# Patient Record
Sex: Male | Born: 1970 | Race: White | Hispanic: No | Marital: Married | Smoking: Current every day smoker
Health system: Southern US, Community
[De-identification: ages and names within clinical notes are randomized; demographics above are authoritative.]

## PROBLEM LIST (undated history)

## (undated) DIAGNOSIS — E119 Type 2 diabetes mellitus without complications: Secondary | ICD-10-CM

## (undated) DIAGNOSIS — F419 Anxiety disorder, unspecified: Secondary | ICD-10-CM

## (undated) HISTORY — PX: APPENDECTOMY: SHX54

---

## 2004-06-30 ENCOUNTER — Other Ambulatory Visit: Payer: Self-pay

## 2007-07-01 ENCOUNTER — Ambulatory Visit: Payer: Self-pay | Admitting: Internal Medicine

## 2008-03-05 ENCOUNTER — Emergency Department: Payer: Self-pay | Admitting: Emergency Medicine

## 2008-04-01 ENCOUNTER — Emergency Department: Payer: Self-pay | Admitting: Internal Medicine

## 2008-05-23 ENCOUNTER — Emergency Department: Payer: Self-pay | Admitting: Emergency Medicine

## 2009-07-11 ENCOUNTER — Emergency Department: Payer: Self-pay | Admitting: Emergency Medicine

## 2009-07-22 ENCOUNTER — Emergency Department: Payer: Self-pay | Admitting: Internal Medicine

## 2009-08-02 ENCOUNTER — Emergency Department: Payer: Self-pay | Admitting: Emergency Medicine

## 2009-08-04 ENCOUNTER — Emergency Department: Payer: Self-pay | Admitting: Unknown Physician Specialty

## 2009-10-31 ENCOUNTER — Ambulatory Visit: Payer: Self-pay | Admitting: Orthopedic Surgery

## 2009-11-02 ENCOUNTER — Emergency Department: Payer: Self-pay | Admitting: Emergency Medicine

## 2009-11-06 ENCOUNTER — Ambulatory Visit: Payer: Self-pay | Admitting: Emergency Medicine

## 2012-07-03 ENCOUNTER — Emergency Department: Payer: Self-pay | Admitting: *Deleted

## 2013-08-11 ENCOUNTER — Emergency Department: Payer: Self-pay | Admitting: Emergency Medicine

## 2013-08-11 LAB — COMPREHENSIVE METABOLIC PANEL
Albumin: 4.1 g/dL (ref 3.4–5.0)
Alkaline Phosphatase: 84 U/L (ref 50–136)
Bilirubin,Total: 0.3 mg/dL (ref 0.2–1.0)
Chloride: 105 mmol/L (ref 98–107)
Co2: 26 mmol/L (ref 21–32)
EGFR (Non-African Amer.): >60
Glucose: 113 mg/dL — ABNORMAL HIGH (ref 65–99)
SGOT(AST): 39 U/L — ABNORMAL HIGH (ref 15–37)
Sodium: 137 mmol/L (ref 136–145)
Total Protein: 7.6 g/dL (ref 6.4–8.2)

## 2013-08-11 LAB — CBC
MCHC: 34.7 g/dL (ref 32.0–36.0)
MCV: 82 fL (ref 80–100)
RBC: 5.25 10*6/uL (ref 4.40–5.90)
RDW: 13.6 % (ref 11.5–14.5)
WBC: 11.8 10*3/uL — ABNORMAL HIGH (ref 3.8–10.6)

## 2013-08-11 LAB — URINALYSIS, COMPLETE
Bilirubin,UR: NEGATIVE
Glucose,UR: NEGATIVE mg/dL (ref 0–75)
Ketone: NEGATIVE
Nitrite: NEGATIVE
Squamous Epithelial: <1

## 2013-08-11 LAB — LIPASE, BLOOD: Lipase: 90 U/L (ref 73–393)

## 2013-10-20 ENCOUNTER — Emergency Department: Payer: Self-pay | Admitting: Emergency Medicine

## 2014-01-22 ENCOUNTER — Emergency Department: Payer: Self-pay | Admitting: Emergency Medicine

## 2014-01-22 LAB — CBC
HCT: 42.6 % (ref 40.0–52.0)
HGB: 14.2 g/dL (ref 13.0–18.0)
MCH: 27.9 pg (ref 26.0–34.0)
MCHC: 33.3 g/dL (ref 32.0–36.0)
MCV: 84 fL (ref 80–100)
Platelet: 226 10*3/uL (ref 150–440)
RBC: 5.08 10*6/uL (ref 4.40–5.90)
RDW: 13.4 % (ref 11.5–14.5)
WBC: 8.2 10*3/uL (ref 3.8–10.6)

## 2014-01-22 LAB — BASIC METABOLIC PANEL
Anion Gap: 6 — ABNORMAL LOW (ref 7–16)
BUN: 10 mg/dL (ref 7–18)
CHLORIDE: 104 mmol/L (ref 98–107)
CO2: 29 mmol/L (ref 21–32)
Calcium, Total: 8.6 mg/dL (ref 8.5–10.1)
Creatinine: 1.27 mg/dL (ref 0.60–1.30)
EGFR (African American): >60
EGFR (Non-African Amer.): >60
Glucose: 160 mg/dL — ABNORMAL HIGH (ref 65–99)
OSMOLALITY: 280 (ref 275–301)
Potassium: 3.9 mmol/L (ref 3.5–5.1)
Sodium: 139 mmol/L (ref 136–145)

## 2014-01-22 LAB — TROPONIN I: Troponin-I: <0.02 ng/mL

## 2014-02-26 ENCOUNTER — Emergency Department: Payer: Self-pay | Admitting: Emergency Medicine

## 2014-02-26 LAB — CBC
HCT: 44.6 % (ref 40.0–52.0)
HGB: 15.1 g/dL (ref 13.0–18.0)
MCH: 28.5 pg (ref 26.0–34.0)
MCHC: 34 g/dL (ref 32.0–36.0)
MCV: 84 fL (ref 80–100)
PLATELETS: 215 10*3/uL (ref 150–440)
RBC: 5.31 10*6/uL (ref 4.40–5.90)
RDW: 13 % (ref 11.5–14.5)
WBC: 6.5 10*3/uL (ref 3.8–10.6)

## 2014-02-26 LAB — COMPREHENSIVE METABOLIC PANEL
ALBUMIN: 3.9 g/dL (ref 3.4–5.0)
ALK PHOS: 76 U/L
AST: 27 U/L (ref 15–37)
Anion Gap: 4 — ABNORMAL LOW (ref 7–16)
BILIRUBIN TOTAL: 0.3 mg/dL (ref 0.2–1.0)
BUN: 11 mg/dL (ref 7–18)
CHLORIDE: 106 mmol/L (ref 98–107)
Calcium, Total: 8.8 mg/dL (ref 8.5–10.1)
Co2: 29 mmol/L (ref 21–32)
Creatinine: 1.13 mg/dL (ref 0.60–1.30)
EGFR (African American): >60
EGFR (Non-African Amer.): >60
GLUCOSE: 143 mg/dL — AB (ref 65–99)
Osmolality: 279 (ref 275–301)
POTASSIUM: 4.1 mmol/L (ref 3.5–5.1)
SGPT (ALT): 35 U/L (ref 12–78)
SODIUM: 139 mmol/L (ref 136–145)
Total Protein: 7.4 g/dL (ref 6.4–8.2)

## 2014-12-08 ENCOUNTER — Emergency Department: Payer: Self-pay | Admitting: Student

## 2014-12-21 ENCOUNTER — Emergency Department: Payer: Self-pay | Admitting: Internal Medicine

## 2016-01-01 ENCOUNTER — Encounter: Payer: Self-pay | Admitting: Emergency Medicine

## 2016-01-01 ENCOUNTER — Emergency Department
Admission: EM | Admit: 2016-01-01 | Discharge: 2016-01-01 | Disposition: A | Discharge status: Home / Self Care | Payer: Self-pay | Attending: Emergency Medicine | Admitting: Emergency Medicine

## 2016-01-01 ENCOUNTER — Emergency Department: Payer: Self-pay

## 2016-01-01 DIAGNOSIS — J069 Acute upper respiratory infection, unspecified: Secondary | ICD-10-CM | POA: Insufficient documentation

## 2016-01-01 DIAGNOSIS — J40 Bronchitis, not specified as acute or chronic: Secondary | ICD-10-CM

## 2016-01-01 DIAGNOSIS — F17200 Nicotine dependence, unspecified, uncomplicated: Secondary | ICD-10-CM | POA: Insufficient documentation

## 2016-01-01 DIAGNOSIS — J209 Acute bronchitis, unspecified: Secondary | ICD-10-CM | POA: Insufficient documentation

## 2016-01-01 MED ORDER — AZITHROMYCIN 250 MG PO TABS: ORAL_TABLET | ORAL | Status: DC

## 2016-01-01 MED ORDER — GUAIFENESIN-CODEINE 100-10 MG/5ML PO SOLN: 10.0000 mL | ORAL | Status: DC | PRN

## 2016-01-01 NOTE — ED Notes (Signed)
See triage   Congestion,cough and body aches Friday .no apparent distress noted at present  Afebrile on arrival

## 2016-01-01 NOTE — ED Provider Notes (Signed)
[facility] Emergency Department Provider Note  ____________________________________________  Time seen: Approximately 2:13 PM  I have reviewed the triage vital signs and the nursing notes.   HISTORY  Chief Complaint URI    HPI Rodney Phillips is a 45 y.o. male presents for cough congestion and body aches for week. Patient states the congestion and body aches is pretty much resolved but he still coughing a lot. States he is at least a pack a day smoker when he is healthy but has not been smoking much lately. Coughs so much that his head hurts and stomach muscles hurt. Has tried over-the-counter Mucinex and TheraFlu.   History reviewed. No pertinent past medical history.  There are no active problems to display for this patient.   History reviewed. No pertinent past surgical history.  Current Outpatient Rx  Name  Route  Sig  Dispense  Refill  . azithromycin (ZITHROMAX Z-PAK) 250 MG tablet      Take 2 tablets (500 mg) on  Day 1,  followed by 1 tablet (250 mg) once daily on Days 2 through 5.   6 each   0   . guaiFENesin-codeine 100-10 MG/5ML syrup   Oral   Take 10 mLs by mouth every 4 (four) hours as needed for cough.   180 mL   0     Allergies Review of patient's allergies indicates no known allergies.  No family history on file.  Social History Social History  Substance Use Topics  . Smoking status: Current Every Day Smoker  . Smokeless tobacco: None  . Alcohol Use: No    Review of Systems Constitutional: No fever/chills ENT: No sore throat. Cardiovascular: Denies chest pain. Respiratory: Positive for cough and some shortness of breath. Skin: Negative for rash. Neurological: Negative for headaches, focal weakness or numbness.  10-point ROS otherwise negative.  ____________________________________________   PHYSICAL EXAM:  VITAL SIGNS: ED Triage Vitals  Enc Vitals Group     BP 01/01/16 1344 138/86 mmHg     Pulse Rate  01/01/16 1344 109     Resp 01/01/16 1344 16     Temp 01/01/16 1344 98.2 F (36.8 C)     Temp Source 01/01/16 1344 Oral     SpO2 01/01/16 1344 94 %     Weight 01/01/16 1344 260 lb (117.935 kg)     Height 01/01/16 1344  (1.88 m)     Head Cir --      Peak Flow --      Pain Score 01/01/16 1345 7     Pain Loc --      Pain Edu? --      Excl. in GC? --     Constitutional: Alert and oriented. Well appearing and in no acute distress. Nose: No congestion/rhinnorhea. Mouth/Throat: Mucous membranes are moist.  Oropharynx non-erythematous. Neck: No stridor.   Cardiovascular: Normal rate, regular rhythm. Grossly normal heart sounds.  Good peripheral circulation. Respiratory: Normal respiratory effort.  No retractions. Lungs expiratory wheezing noted bilateral decreased breath sounds. Musculoskeletal: No lower extremity tenderness nor edema.  No joint effusions. Neurologic:  Normal speech and language. No gross focal neurologic deficits are appreciated. No gait instability. Skin:  Skin is warm, dry and intact. No rash noted. Psychiatric: Mood and affect are normal. Speech and behavior are normal.  ____________________________________________   LABS (all labs ordered are listed, but only abnormal results are displayed)  Labs Reviewed - No data to display ____________________________________________   RADIOLOGY  Acute bronchial  changes ____________________________________________   PROCEDURES  Procedure(s) performed: None  Critical Care performed: No  ____________________________________________   INITIAL IMPRESSION / ASSESSMENT AND PLAN / ED COURSE  Pertinent labs & imaging results that were available during my care of the patient were reviewed by me and considered in my medical decision making (see chart for details).  Rx given for Zithromax and Robitussin-AC. Patient follow up PCP or return to ER with any worsening  symptomology. ____________________________________________   FINAL CLINICAL IMPRESSION(S) / ED DIAGNOSES  Final diagnoses:  URI, acute  Bronchitis     This chart was dictated using voice recognition software/Dragon. Despite best efforts to proofread, errors can occur which can change the meaning. Any change was purely unintentional.   Evangeline Dakinharles M Dandy Lazaro, PA-C 01/01/16 1513  Rockne MenghiniAnne-Caroline Norman, MD 01/01/16 1553

## 2016-01-01 NOTE — Discharge Instructions (Signed)

## 2016-01-01 NOTE — ED Notes (Signed)
Pt here with c/o cough, congestion, cold sx and body aches since Friday.

## 2017-06-14 ENCOUNTER — Encounter: Payer: Self-pay | Admitting: Emergency Medicine

## 2017-06-14 ENCOUNTER — Emergency Department
Admission: EM | Admit: 2017-06-14 | Discharge: 2017-06-14 | Disposition: A | Discharge status: Home / Self Care | Payer: Self-pay | Attending: Emergency Medicine | Admitting: Emergency Medicine

## 2017-06-14 DIAGNOSIS — T5494XA Toxic effect of unspecified corrosive substance, undetermined, initial encounter: Secondary | ICD-10-CM | POA: Insufficient documentation

## 2017-06-14 DIAGNOSIS — Y9389 Activity, other specified: Secondary | ICD-10-CM | POA: Insufficient documentation

## 2017-06-14 DIAGNOSIS — Y9289 Other specified places as the place of occurrence of the external cause: Secondary | ICD-10-CM | POA: Insufficient documentation

## 2017-06-14 DIAGNOSIS — F17210 Nicotine dependence, cigarettes, uncomplicated: Secondary | ICD-10-CM | POA: Insufficient documentation

## 2017-06-14 DIAGNOSIS — T25221A Burn of second degree of right foot, initial encounter: Secondary | ICD-10-CM | POA: Insufficient documentation

## 2017-06-14 DIAGNOSIS — T320 Corrosions involving less than 10% of body surface: Secondary | ICD-10-CM | POA: Insufficient documentation

## 2017-06-14 DIAGNOSIS — Y990 Civilian activity done for income or pay: Secondary | ICD-10-CM | POA: Insufficient documentation

## 2017-06-14 DIAGNOSIS — Z79899 Other long term (current) drug therapy: Secondary | ICD-10-CM | POA: Insufficient documentation

## 2017-06-14 DIAGNOSIS — T25621A Corrosion of second degree of right foot, initial encounter: Secondary | ICD-10-CM | POA: Insufficient documentation

## 2017-06-14 DIAGNOSIS — W208XXA Other cause of strike by thrown, projected or falling object, initial encounter: Secondary | ICD-10-CM | POA: Insufficient documentation

## 2017-06-14 MED ORDER — SILVER SULFADIAZINE 1 % EX CREA: TOPICAL_CREAM | CUTANEOUS | 0 refills | Status: DC

## 2017-06-14 MED ORDER — SILVER SULFADIAZINE 1 % EX CREA
TOPICAL_CREAM | Freq: Once | CUTANEOUS | Status: DC
  Filled 2017-06-14: qty 85

## 2017-06-14 MED ORDER — TRAMADOL HCL 50 MG PO TABS: 50.0000 mg | ORAL_TABLET | Freq: Four times a day (QID) | ORAL | 0 refills | Status: DC | PRN

## 2017-06-14 MED ORDER — SULFAMETHOXAZOLE-TRIMETHOPRIM 800-160 MG PO TABS: 1.0000 | ORAL_TABLET | Freq: Two times a day (BID) | ORAL | 0 refills | Status: DC

## 2017-06-14 NOTE — ED Triage Notes (Signed)
Patient presents to the ED with redness and blisters to right ankle.  Patient states on Thursday he accidentally spilled paint thinner on his right ankle.  Patient is in no obvious distress at this time.
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
## 2017-07-17 ENCOUNTER — Emergency Department
Admission: EM | Admit: 2017-07-17 | Discharge: 2017-07-17 | Disposition: A | Discharge status: Home / Self Care | Payer: Self-pay | Attending: Emergency Medicine | Admitting: Emergency Medicine

## 2017-07-17 DIAGNOSIS — F17210 Nicotine dependence, cigarettes, uncomplicated: Secondary | ICD-10-CM | POA: Insufficient documentation

## 2017-07-17 DIAGNOSIS — L02415 Cutaneous abscess of right lower limb: Secondary | ICD-10-CM | POA: Insufficient documentation

## 2017-07-17 DIAGNOSIS — L0291 Cutaneous abscess, unspecified: Secondary | ICD-10-CM

## 2017-07-17 DIAGNOSIS — Z79899 Other long term (current) drug therapy: Secondary | ICD-10-CM | POA: Insufficient documentation

## 2017-07-17 LAB — CBC WITH DIFFERENTIAL/PLATELET
BASOS ABS: 0.1 10*3/uL (ref 0–0.1)
Basophils Relative: 1 %
Eosinophils Absolute: 0.5 10*3/uL (ref 0–0.7)
Eosinophils Relative: 3 %
HEMATOCRIT: 45.8 % (ref 40.0–52.0)
HEMOGLOBIN: 16 g/dL (ref 13.0–18.0)
LYMPHS ABS: 2.1 10*3/uL (ref 1.0–3.6)
LYMPHS PCT: 15 %
MCH: 29.3 pg (ref 26.0–34.0)
MCHC: 35.1 g/dL (ref 32.0–36.0)
MCV: 83.5 fL (ref 80.0–100.0)
MONO ABS: 1 10*3/uL (ref 0.2–1.0)
MONOS PCT: 7 %
Neutro Abs: 11 10*3/uL — ABNORMAL HIGH (ref 1.4–6.5)
Neutrophils Relative %: 74 %
Platelets: 217 10*3/uL (ref 150–440)
RBC: 5.48 MIL/uL (ref 4.40–5.90)
RDW: 14.4 % (ref 11.5–14.5)
WBC: 14.6 10*3/uL — ABNORMAL HIGH (ref 3.8–10.6)

## 2017-07-17 LAB — URINALYSIS, COMPLETE (UACMP) WITH MICROSCOPIC
BACTERIA UA: NONE SEEN
BILIRUBIN URINE: NEGATIVE
Glucose, UA: NEGATIVE mg/dL
HGB URINE DIPSTICK: NEGATIVE
KETONES UR: NEGATIVE mg/dL
LEUKOCYTES UA: NEGATIVE
NITRITE: NEGATIVE
PROTEIN: NEGATIVE mg/dL
SPECIFIC GRAVITY, URINE: 1.016 (ref 1.005–1.030)
pH: 7 (ref 5.0–8.0)

## 2017-07-17 LAB — COMPREHENSIVE METABOLIC PANEL
ALBUMIN: 4.2 g/dL (ref 3.5–5.0)
ALT: 19 U/L (ref 17–63)
AST: 20 U/L (ref 15–41)
Alkaline Phosphatase: 66 U/L (ref 38–126)
Anion gap: 9 (ref 5–15)
BUN: 11 mg/dL (ref 6–20)
CHLORIDE: 103 mmol/L (ref 101–111)
CO2: 24 mmol/L (ref 22–32)
CREATININE: 1.18 mg/dL (ref 0.61–1.24)
Calcium: 9 mg/dL (ref 8.9–10.3)
GFR calc Af Amer: >60 mL/min (ref >60)
GFR calc non Af Amer: >60 mL/min (ref >60)
GLUCOSE: 139 mg/dL — AB (ref 65–99)
POTASSIUM: 3.9 mmol/L (ref 3.5–5.1)
Sodium: 136 mmol/L (ref 135–145)
Total Bilirubin: 0.6 mg/dL (ref 0.3–1.2)
Total Protein: 7.4 g/dL (ref 6.5–8.1)

## 2017-07-17 LAB — LACTIC ACID, PLASMA: LACTIC ACID, VENOUS: 1.5 mmol/L (ref 0.5–1.9)

## 2017-07-17 MED ORDER — SULFAMETHOXAZOLE-TRIMETHOPRIM 800-160 MG PO TABS: 1.0000 | ORAL_TABLET | Freq: Two times a day (BID) | ORAL | 0 refills | Status: AC

## 2017-07-17 MED ORDER — LIDOCAINE-EPINEPHRINE (PF) 2 %-1:200000 IJ SOLN
10.0000 mL | Freq: Once | INTRAMUSCULAR | Status: AC
  Administered 2017-07-17: 10 mL via INTRADERMAL
  Filled 2017-07-17: qty 10

## 2017-07-17 NOTE — ED Notes (Signed)
MD at bedside. 

## 2017-07-17 NOTE — ED Triage Notes (Addendum)
Abscess to inner right thigh, approx egg sized X 3 days. Redness, tenderness and hard to touch, yellow and black center. No medications PTA. Pt alert and oriented X4, active, cooperative, pt in NAD. RR even and unlabored, color WNL.

## 2017-07-17 NOTE — ED Provider Notes (Signed)
[facility] Emergency Department Provider Note   ____________________________________________   I have reviewed the triage vital signs and the nursing notes.   HISTORY  Chief Complaint Abscess   History limited by: Not Limited   HPI Rodney Phillips is a 46 y.o. male who presents to the emergency department today because of an abscess   LOCATION:right inner thigh DURATION:3 TIMING: constant and getting worse SEVERITY: severe QUALITY: pain CONTEXT: patient denies any history of abscess in the past MODIFYING FACTORS: palpation makes it more difficult ASSOCIATED SYMPTOMS: denies any fever, shortness of breath, vomiting or diarrhea  Per medical record review patient without any pertinent history.  History reviewed. No pertinent past medical history.  There are no active problems to display for this patient.   Past Surgical History:  Procedure Laterality Date  . APPENDECTOMY      Prior to Admission medications   Medication Sig Start Date End Date Taking? Authorizing Provider  azithromycin (ZITHROMAX Z-PAK) 250 MG tablet Take 2 tablets (500 mg) on  Day 1,  followed by 1 tablet (250 mg) once daily on Days 2 through 5. 01/01/16   Beers, Charmayne Sheer, PA-C  guaiFENesin-codeine 100-10 MG/5ML syrup Take 10 mLs by mouth every 4 (four) hours as needed for cough. 01/01/16   Beers, Charmayne Sheer, PA-C  silver sulfADIAZINE (SILVADENE) 1 % cream Apply to affected area daily 06/14/17   Cuthriell, Delorise Royals, PA-C  sulfamethoxazole-trimethoprim (BACTRIM DS,SEPTRA DS) 800-160 MG tablet Take 1 tablet by mouth 2 (two) times daily. 06/14/17   Cuthriell, Delorise Royals, PA-C  traMADol (ULTRAM) 50 MG tablet Take 1 tablet (50 mg total) by mouth every 6 (six) hours as needed. 06/14/17   Cuthriell, Delorise Royals, PA-C    Allergies Vicodin [hydrocodone-acetaminophen]  No family history on file.  Social History Social History  Substance Use Topics  . Smoking status: Current Every Day  Smoker    Packs/day: 1.00    Types: Cigarettes  . Smokeless tobacco: Never Used  . Alcohol use Yes     Comment: occasionally    Review of Systems Constitutional: No fever/chills Eyes: No visual changes. ENT: No sore throat. Cardiovascular: Denies chest pain. Respiratory: Denies shortness of breath. Gastrointestinal: No abdominal pain.  No nausea, no vomiting.  No diarrhea.   Genitourinary: Negative for dysuria. Musculoskeletal: Negative for back pain. Skin: Positive for pain and erythema to the right thigh Neurological: Negative for headaches, focal weakness or numbness.  ____________________________________________   PHYSICAL EXAM:  VITAL SIGNS: ED Triage Vitals  Enc Vitals Group     BP 07/17/17 1739 107/86     Pulse Rate 07/17/17 1739 (!) 115     Resp 07/17/17 1739 18     Temp 07/17/17 1739 99.1 F (37.3 C)     Temp Source 07/17/17 1739 Oral     SpO2 07/17/17 1739 95 %     Weight 07/17/17 1740 260 lb (117.9 kg)     Height 07/17/17 1740  (1.88 m)     Head Circumference --      Peak Flow --      Pain Score 07/17/17 1739 8   Constitutional: Alert and oriented. Well appearing and in no distress. Eyes: Conjunctivae are normal.  ENT   Head: Normocephalic and atraumatic.   Nose: No congestion/rhinnorhea.   Mouth/Throat: Mucous membranes are moist.   Neck: No stridor. Hematological/Lymphatic/Immunilogical: No cervical lymphadenopathy. Cardiovascular: Normal rate, regular rhythm.  No murmurs, rubs, or gallops.  Respiratory: Normal respiratory effort without  tachypnea nor retractions. Breath sounds are clear and equal bilaterally. No wheezes/rales/rhonchi. Gastrointestinal: Soft and non tender. No rebound. No guarding.  Genitourinary: Deferred Musculoskeletal: Normal range of motion in all extremities. No lower extremity edema. Neurologic:  Normal speech and language. No gross focal neurologic deficits are appreciated.  Skin:  Abscess to right inner  thigh. Psychiatric: Mood and affect are normal. Speech and behavior are normal. Patient exhibits appropriate insight and judgment.  ____________________________________________    LABS (pertinent positives/negatives)  Lactic acid 1.5 CBC wbc 14.6 CMP wnl except for glu 139 UA not consistent with infection ____________________________________________   EKG  None  ____________________________________________    RADIOLOGY  None  ____________________________________________   PROCEDURES  Procedures  Incision and Drainage of Abcess Location: right thigh Anesthesia Local: 1% Lidocaine with Epi  Prep/Procedure: Skin Prep: Chlorahex Incised abscess with #11 blade Purulent discharge: large Probed to break up loculations Packed with 1/4" gauze Estimated blood loss: 1 ml  ____________________________________________   INITIAL IMPRESSION / ASSESSMENT AND PLAN / ED COURSE  Pertinent labs & imaging results that were available during my care of the patient were reviewed by me and considered in my medical decision making (see chart for details).  Patient presents to the emergency department today because of concern for redness and swelling to right inner thigh. Concern for abscess. Blood work does show slightly elevated WBC however lactic within normal limits. Abscess was incised and drained. Discussed with patient care of wound.   ____________________________________________   FINAL CLINICAL IMPRESSION(S) / ED DIAGNOSES  Final diagnoses:  Abscess     Note: This dictation was prepared with Dragon dictation. Any transcriptional errors that result from this process are unintentional     Phineas Semen, MD 07/17/17 2242

## 2017-07-17 NOTE — Discharge Instructions (Signed)
Please seek medical attention for any high fevers, chest pain, shortness of breath, change in behavior, persistent vomiting, bloody stool or any other new or concerning symptoms.  

## 2019-11-19 ENCOUNTER — Encounter: Payer: Self-pay | Admitting: Emergency Medicine

## 2019-11-19 ENCOUNTER — Emergency Department
Admission: EM | Admit: 2019-11-19 | Discharge: 2019-11-19 | Disposition: A | Discharge status: Home / Self Care | Payer: Self-pay | Attending: Emergency Medicine | Admitting: Emergency Medicine

## 2019-11-19 ENCOUNTER — Other Ambulatory Visit: Payer: Self-pay

## 2019-11-19 DIAGNOSIS — S61211A Laceration without foreign body of left index finger without damage to nail, initial encounter: Secondary | ICD-10-CM | POA: Insufficient documentation

## 2019-11-19 DIAGNOSIS — Z79899 Other long term (current) drug therapy: Secondary | ICD-10-CM | POA: Insufficient documentation

## 2019-11-19 DIAGNOSIS — Y929 Unspecified place or not applicable: Secondary | ICD-10-CM | POA: Insufficient documentation

## 2019-11-19 DIAGNOSIS — Y999 Unspecified external cause status: Secondary | ICD-10-CM | POA: Insufficient documentation

## 2019-11-19 DIAGNOSIS — W458XXA Other foreign body or object entering through skin, initial encounter: Secondary | ICD-10-CM | POA: Insufficient documentation

## 2019-11-19 DIAGNOSIS — Y939 Activity, unspecified: Secondary | ICD-10-CM | POA: Insufficient documentation

## 2019-11-19 DIAGNOSIS — E119 Type 2 diabetes mellitus without complications: Secondary | ICD-10-CM | POA: Insufficient documentation

## 2019-11-19 DIAGNOSIS — F17210 Nicotine dependence, cigarettes, uncomplicated: Secondary | ICD-10-CM | POA: Insufficient documentation

## 2019-11-19 HISTORY — DX: Type 2 diabetes mellitus without complications: E11.9

## 2019-11-19 MED ORDER — IBUPROFEN 600 MG PO TABS: 600.0000 mg | ORAL_TABLET | Freq: Four times a day (QID) | ORAL | 0 refills | Status: DC | PRN

## 2019-11-19 MED ORDER — LIDOCAINE HCL (PF) 1 % IJ SOLN
5.0000 mL | Freq: Once | INTRAMUSCULAR | Status: AC
  Administered 2019-11-19: 22:00:00 5 mL via INTRADERMAL
  Filled 2019-11-19: qty 5

## 2019-11-19 MED ORDER — CEPHALEXIN 500 MG PO CAPS
500.0000 mg | ORAL_CAPSULE | Freq: Once | ORAL | Status: AC
  Administered 2019-11-19: 22:00:00 500 mg via ORAL
  Filled 2019-11-19: qty 1

## 2019-11-19 MED ORDER — CEPHALEXIN 500 MG PO CAPS: 500.0000 mg | ORAL_CAPSULE | Freq: Three times a day (TID) | ORAL | 0 refills | Status: AC

## 2019-11-19 NOTE — ED Provider Notes (Signed)
[facility] Emergency Department Provider Note  ____________________________________________  Time seen: Approximately 9:05 PM  I have reviewed the triage vital signs and the nursing notes.   HISTORY  Chief Complaint Laceration    HPI Rodney Phillips is a 49 y.o. male that presents to the emergency department for evaluation of left index finger laceration.  Patient cut his finger on a can of tuna around 7 PM.  He can move his finger normally.  Last tetanus was 1.5 years ago.  Past Medical History:  Diagnosis Date  . Diabetes mellitus without complication ([place])     There are no problems to display for this patient.   Past Surgical History:  Procedure Laterality Date  . APPENDECTOMY      Prior to Admission medications   Medication Sig Start Date End Date Taking? Authorizing Provider  azithromycin (ZITHROMAX Z-PAK) 250 MG tablet Take 2 tablets (500 mg) on  Day 1,  followed by 1 tablet (250 mg) once daily on Days 2 through 5. 01/01/16   Beers, Pierce Crane, PA-C  cephALEXin (KEFLEX) 500 MG capsule Take 1 capsule (500 mg total) by mouth 3 (three) times daily for 7 days. 11/19/19 11/26/19  Laban Emperor, PA-C  guaiFENesin-codeine 100-10 MG/5ML syrup Take 10 mLs by mouth every 4 (four) hours as needed for cough. 01/01/16   Beers, Pierce Crane, PA-C  ibuprofen (ADVIL) 600 MG tablet Take 1 tablet (600 mg total) by mouth every 6 (six) hours as needed. 11/19/19   Laban Emperor, PA-C  silver sulfADIAZINE (SILVADENE) 1 % cream Apply to affected area daily 06/14/17   Cuthriell, Charline Bills, PA-C  sulfamethoxazole-trimethoprim (BACTRIM DS,SEPTRA DS) 800-160 MG tablet Take 1 tablet by mouth 2 (two) times daily. 06/14/17   Cuthriell, Charline Bills, PA-C  traMADol (ULTRAM) 50 MG tablet Take 1 tablet (50 mg total) by mouth every 6 (six) hours as needed. 06/14/17   Cuthriell, Charline Bills, PA-C    Allergies Vicodin [hydrocodone-acetaminophen]  No family history on file.  Social  History Social History   Tobacco Use  . Smoking status: Current Every Day Smoker    Packs/day: 1.00    Types: Cigarettes  . Smokeless tobacco: Never Used  Substance Use Topics  . Alcohol use: Yes    Comment: occasionally  . Drug use: Not on file     Review of Systems  Constitutional: No fever/chills Respiratory: No SOB. Gastrointestinal: No nausea, no vomiting.  Musculoskeletal: Positive for finger pain. Skin: Negative for rash, abrasions, ecchymosis. Positive for laceration. Neurological: Negative for numbness or tingling   ____________________________________________   PHYSICAL EXAM:  VITAL SIGNS: ED Triage Vitals [11/19/19 1924]  Enc Vitals Group     BP 114/79     Pulse Rate (!) 105     Resp 18     Temp 98.4 F (36.9 C)     Temp Source Oral     SpO2 96 %     Weight 260 lb (117.9 kg)     Height 6\' 2"  (1.88 m)     Head Circumference      Peak Flow      Pain Score 7     Pain Loc      Pain Edu?      Excl. in [place]?      Constitutional: Alert and oriented. Well appearing and in no acute distress. Eyes: Conjunctivae are normal. PERRL. EOMI. Head: Atraumatic. ENT:      Ears:      Nose: No congestion/rhinnorhea.  Mouth/Throat: Mucous membranes are moist.  Neck: No stridor. Cardiovascular: Normal rate, regular rhythm.  Good peripheral circulation. Respiratory: Normal respiratory effort without tachypnea or retractions. Lungs CTAB. Good air entry to the bases with no decreased or absent breath sounds. Musculoskeletal: Full range of motion to all extremities. No gross deformities appreciated.  Able to flex finger against resistance. Neurologic:  Normal speech and language. No gross focal neurologic deficits are appreciated.  Skin:  Skin is warm, dry. 2 cm laceration to left index finger. Psychiatric: Mood and affect are normal. Speech and behavior are normal. Patient exhibits appropriate insight and judgement.   ____________________________________________    LABS (all labs ordered are listed, but only abnormal results are displayed)  Labs Reviewed - No data to display ____________________________________________  EKG   ____________________________________________  RADIOLOGY  No results found.  ____________________________________________    PROCEDURES  Procedure(s) performed:    Procedures  LACERATION REPAIR Performed by: Enid Derry  Consent: Verbal consent obtained.  Consent given by: patient  Prepped and Draped in normal sterile fashion  Wound explored: No foreign bodies   Laceration Location: finger  Laceration Length: 1 cm  Anesthesia: None  Local anesthetic: lidocaine 1% without epinephrine  Anesthetic total: 5 ml  Irrigation method: syringe  Amount of cleaning: normal saline  Skin closure: 4-0 nylon  Number of sutures: 7  Technique: Simple interrupted  Patient tolerance: Patient tolerated the procedure well with no immediate complications.  Medications  lidocaine (PF) (XYLOCAINE) 1 % injection 5 mL (5 mLs Intradermal Given 11/19/19 2217)  lidocaine (PF) (XYLOCAINE) 1 % injection 5 mL (5 mLs Intradermal Given 11/19/19 2216)  cephALEXin (KEFLEX) capsule 500 mg (500 mg Oral Given 11/19/19 2218)     ____________________________________________   INITIAL IMPRESSION / ASSESSMENT AND PLAN / ED COURSE  Pertinent labs & imaging results that were available during my care of the patient were reviewed by me and considered in my medical decision making (see chart for details).  Review of the [place] CSRS was performed in accordance of the [facility] prior to dispensing any controlled drugs.     Patient's diagnosis is consistent with finger laceration.  Vital signs and exam are reassuring.  Laceration was repaired with stitches.  Wound was wrapped.  Patient will be discharged home with prescriptions for keflex. Patient is to follow up with primary care as directed. Patient is given ED precautions to  return to the ED for any worsening or new symptoms.  Rodney Phillips was evaluated in Emergency Department on 11/19/2019 for the symptoms described in the history of present illness. He was evaluated in the context of the global COVID-19 pandemic, which necessitated consideration that the patient might be at risk for infection with the SARS-CoV-2 virus that causes COVID-19. Institutional protocols and algorithms that pertain to the evaluation of patients at risk for COVID-19 are in a state of rapid change based on information released by regulatory bodies including the CDC and federal and state organizations. These policies and algorithms were followed during the patient's care in the ED.   ____________________________________________  FINAL CLINICAL IMPRESSION(S) / ED DIAGNOSES  Final diagnoses:  Laceration of left index finger without foreign body without damage to nail, initial encounter      NEW MEDICATIONS STARTED DURING THIS VISIT:  ED Discharge Orders         Ordered    cephALEXin (KEFLEX) 500 MG capsule  3 times daily     11/19/19 2210    ibuprofen (ADVIL) 600 MG  tablet  Every 6 hours PRN     11/19/19 2210              This chart was dictated using voice recognition software/Dragon. Despite best efforts to proofread, errors can occur which can change the meaning. Any change was purely unintentional.    Enid Derry, PA-C 11/19/19 2234    Phineas Semen, MD 11/19/19 [phone]

## 2019-11-19 NOTE — ED Triage Notes (Signed)
Patient with laceration to left second finger. Patient states that he was opening a can and cut his finger on the lid.

## 2019-11-19 NOTE — ED Notes (Signed)
Patient declined discharge vital signs. 

## 2019-11-19 NOTE — ED Notes (Signed)
See triage note. Laceration with dressing in place on left index finger.

## 2021-05-08 ENCOUNTER — Encounter: Payer: Self-pay | Admitting: Physician Assistant

## 2021-05-08 ENCOUNTER — Other Ambulatory Visit: Payer: Self-pay

## 2021-05-08 ENCOUNTER — Emergency Department
Admission: EM | Admit: 2021-05-08 | Discharge: 2021-05-08 | Disposition: A | Discharge status: Home / Self Care | Payer: Self-pay | Attending: Emergency Medicine | Admitting: Emergency Medicine

## 2021-05-08 ENCOUNTER — Emergency Department: Payer: Self-pay

## 2021-05-08 DIAGNOSIS — M545 Low back pain, unspecified: Secondary | ICD-10-CM | POA: Insufficient documentation

## 2021-05-08 DIAGNOSIS — R1030 Lower abdominal pain, unspecified: Secondary | ICD-10-CM | POA: Insufficient documentation

## 2021-05-08 DIAGNOSIS — E119 Type 2 diabetes mellitus without complications: Secondary | ICD-10-CM | POA: Insufficient documentation

## 2021-05-08 DIAGNOSIS — F17210 Nicotine dependence, cigarettes, uncomplicated: Secondary | ICD-10-CM | POA: Insufficient documentation

## 2021-05-08 LAB — URINALYSIS, COMPLETE (UACMP) WITH MICROSCOPIC
Bacteria, UA: NONE SEEN
Bilirubin Urine: NEGATIVE
Glucose, UA: 150 mg/dL — AB
Hgb urine dipstick: NEGATIVE
Ketones, ur: 5 mg/dL — AB
Nitrite: NEGATIVE
Protein, ur: NEGATIVE mg/dL
Specific Gravity, Urine: 1.033 — ABNORMAL HIGH (ref 1.005–1.030)
pH: 5 (ref 5.0–8.0)

## 2021-05-08 MED ORDER — KETOROLAC TROMETHAMINE 30 MG/ML IJ SOLN
30.0000 mg | Freq: Once | INTRAMUSCULAR | Status: AC
  Administered 2021-05-08: 30 mg via INTRAMUSCULAR
  Filled 2021-05-08: qty 1

## 2021-05-08 MED ORDER — DICLOFENAC SODIUM 75 MG PO TBEC: 75.0000 mg | DELAYED_RELEASE_TABLET | Freq: Two times a day (BID) | ORAL | 0 refills | Status: DC

## 2021-05-08 MED ORDER — ORPHENADRINE CITRATE 30 MG/ML IJ SOLN
60.0000 mg | INTRAMUSCULAR | Status: AC
  Administered 2021-05-08: 60 mg via INTRAMUSCULAR
  Filled 2021-05-08: qty 2

## 2021-05-08 MED ORDER — CYCLOBENZAPRINE HCL 5 MG PO TABS: 5.0000 mg | ORAL_TABLET | Freq: Three times a day (TID) | ORAL | 0 refills | Status: DC | PRN

## 2021-05-08 NOTE — ED Provider Notes (Signed)
[facility] Emergency Department Provider Note ____________________________________________  Time seen: 1026  I have reviewed the triage vital signs and the nursing notes.  HISTORY  Chief Complaint  Back Pain   HPI Rodney Phillips is a 50 y.o. male presents to the ED for evaluation of LBP on the right, with some referral to the right flank pain with flares.  He denies any recent injury, trauma, or falls.  Reports onset of the last few weeks but is gotten worse in the last several days.  He denies any history of chronic ongoing back pain.  He described pain that was sharp in nature, causing him to fall to his knees.  He denies any bladder or bowel incontinence, foot drop, or distal paresthesias.  He does endorse some pain referring around to the lower abdomen and into the pelvis.  Denies any urinary retention, hematuria, or urinary frequency.  Past Medical History:  Diagnosis Date   Diabetes mellitus without complication (HCC)     There are no problems to display for this patient.   Past Surgical History:  Procedure Laterality Date   APPENDECTOMY      Prior to Admission medications   Medication Sig Start Date End Date Taking? Authorizing Provider  cyclobenzaprine (FLEXERIL) 5 MG tablet Take 1 tablet (5 mg total) by mouth 3 (three) times daily as needed. 05/08/21  Yes Shadeed Colberg, Charlesetta Ivory, PA-C  diclofenac (VOLTAREN) 75 MG EC tablet Take 1 tablet (75 mg total) by mouth 2 (two) times daily. 05/08/21  Yes Jaevin Medearis, Charlesetta Ivory, PA-C    Allergies Vicodin [hydrocodone-acetaminophen]  History reviewed. No pertinent family history.  Social History Social History   Tobacco Use   Smoking status: Every Day    Packs/day: 1.00    Types: Cigarettes   Smokeless tobacco: Never  Substance Use Topics   Alcohol use: Yes    Comment: occasionally    Review of Systems  Constitutional: Negative for fever. Eyes: Negative for visual changes. ENT: Negative for  sore throat. Cardiovascular: Negative for chest pain. Respiratory: Negative for shortness of breath. Gastrointestinal: Negative for abdominal pain, vomiting and diarrhea. Reports right flank pain.  Genitourinary: Negative for dysuria. Musculoskeletal: Positive for back pain. Skin: Negative for rash. Neurological: Negative for headaches, focal weakness or numbness. ____________________________________________  PHYSICAL EXAM:  VITAL SIGNS: ED Triage Vitals  Enc Vitals Group     BP 05/08/21 0934 138/89     Pulse Rate 05/08/21 0934 (!) 111     Resp 05/08/21 0934 20     Temp 05/08/21 0934 98.4 F (36.9 C)     Temp Source 05/08/21 0934 Oral     SpO2 05/08/21 0934 96 %     Weight 05/08/21 0935 250 lb (113.4 kg)     Height 05/08/21 0935 6\' 2"  (1.88 m)     Head Circumference --      Peak Flow --      Pain Score 05/08/21 0938 10     Pain Loc --      Pain Edu? --      Excl. in [place]? --     Constitutional: Alert and oriented. Well appearing and in no distress. Head: Normocephalic and atraumatic. Eyes: Conjunctivae are normal. Normal extraocular movements Cardiovascular: Normal rate, regular rhythm. Normal distal pulses. Respiratory: Normal respiratory effort. No wheezes/rales/rhonchi. Gastrointestinal: Soft and nontender. No distention. Musculoskeletal: Normal spinal alignment without midline tenderness, spasm, deformity, or step-off.  Patient with normal exam with without significant midline tenderness, some  pain to palpation to the right paraspinal musculature.  Some tenderness over the flank pain as well.  Nontender with normal range of motion in all extremities.  Neurologic: Cranial nerves II through XII grossly intact.  Normal gait without ataxia. Normal speech and language. No gross focal neurologic deficits are appreciated. Skin:  Skin is warm, dry and intact. No rash noted. Psychiatric: Mood and affect are normal. Patient exhibits appropriate insight and  judgment. ____________________________________________    LABS (pertinent positives/negatives)  Labs Reviewed  URINALYSIS, COMPLETE (UACMP) WITH MICROSCOPIC - Abnormal; Notable for the following components:      Result Value   Color, Urine YELLOW (*)    APPearance HAZY (*)    Specific Gravity, Urine 1.033 (*)    Glucose, UA 150 (*)    Ketones, ur 5 (*)    Leukocytes,Ua TRACE (*)    All other components within normal limits  ____________________________________________  EKG  ____________________________________________   RADIOLOGY Official radiology report(s): CT Renal Stone Study  Result Date: 05/08/2021 CLINICAL DATA:  Flank pain, kidney stone suspected; technologist note states right flank pain EXAM: CT ABDOMEN AND PELVIS WITHOUT CONTRAST TECHNIQUE: Multidetector CT imaging of the abdomen and pelvis was performed following the standard protocol without IV contrast. COMPARISON:  2014 FINDINGS: Lower chest: No acute abnormality. Hepatobiliary: No focal liver abnormality is seen. No gallstones, gallbladder wall thickening, or biliary dilatation. Pancreas: Unremarkable. Spleen: Unremarkable. Adrenals/Urinary Tract: Adrenals are unremarkable. Kidneys are unremarkable. Bladder is partially distended but appears unremarkable. Stomach/Bowel: Stomach is within normal limits. Bowel is normal in caliber. Vascular/Lymphatic: No significant vascular abnormality. No enlarged lymph nodes. Reproductive: Unremarkable. Other: No ascites.  Abdominal wall is unremarkable. Musculoskeletal: Degenerative changes of the included spine. IMPRESSION: No acute abnormality.  No urinary tract calculi. Electronically Signed   By: Guadlupe Spanish M.D.   On: 05/08/2021 12:31   ____________________________________________  PROCEDURES  Toradol 30 mg IM Norflex 60 mg IM  Procedures ____________________________________________   INITIAL IMPRESSION / ASSESSMENT AND PLAN / ED COURSE  As part of my medical decision  making, I reviewed the following data within the electronic MEDICAL RECORD NUMBER Radiograph reviewed WNL and Notes from prior ED visits     DDX: nephrolithiasis, lumbar strain, piriformis syndrome.  His exam is overall benign at this time without any signs of acute neuromuscular deficit.  No radiologic evidence of any nephrolithiasis or other abdominal pelvis origin.  Lab is reassuring for any acute infectious process.  Patient will be discharged with prescriptions for anti-inflammatories and muscle relaxants.  Work is provided as requested.  He will follow-up with a primary provider return to the ED if needed.    Nathanial Arrighi was evaluated in Emergency Department on 05/08/2021 for the symptoms described in the history of present illness. He was evaluated in the context of the global COVID-19 pandemic, which necessitated consideration that the patient might be at risk for infection with the SARS-CoV-2 virus that causes COVID-19. Institutional protocols and algorithms that pertain to the evaluation of patients at risk for COVID-19 are in a state of rapid change based on information released by regulatory bodies including the CDC and federal and state organizations. These policies and algorithms were followed during the patient's care in the ED. ____________________________________________  FINAL CLINICAL IMPRESSION(S) / ED DIAGNOSES  Final diagnoses:  Acute right-sided low back pain without sciatica       Lissa Hoard, PA-C 05/08/21 1627    Delton Prairie, MD 05/09/21 1517

## 2021-05-08 NOTE — ED Triage Notes (Signed)
Pt here with back pain for a few weeks but has gotten worse over time. Pt states that he was at work attempting to get out of a chair and was not able to. Pt standing in triage due to the pain and not being able to stand after sitting.

## 2021-05-08 NOTE — Discharge Instructions (Addendum)
Take the prescription meds as prescribed.  You may continue to use over-the-counter Lidoderm patches for additional pain relief.  Follow-up with your primary provider or return to the ED if needed.

## 2021-11-25 ENCOUNTER — Encounter: Payer: Self-pay | Admitting: Emergency Medicine

## 2021-11-25 ENCOUNTER — Emergency Department: Payer: Self-pay

## 2021-11-25 ENCOUNTER — Emergency Department
Admission: EM | Admit: 2021-11-25 | Discharge: 2021-11-25 | Disposition: A | Discharge status: Home / Self Care | Payer: Self-pay | Attending: Student in an Organized Health Care Education/Training Program | Admitting: Student in an Organized Health Care Education/Training Program

## 2021-11-25 ENCOUNTER — Other Ambulatory Visit: Payer: Self-pay

## 2021-11-25 DIAGNOSIS — R42 Dizziness and giddiness: Secondary | ICD-10-CM

## 2021-11-25 DIAGNOSIS — I951 Orthostatic hypotension: Secondary | ICD-10-CM | POA: Insufficient documentation

## 2021-11-25 DIAGNOSIS — E860 Dehydration: Secondary | ICD-10-CM | POA: Insufficient documentation

## 2021-11-25 LAB — CBC
HCT: 43.1 % (ref 39.0–52.0)
Hemoglobin: 14.4 g/dL (ref 13.0–17.0)
MCH: 27.9 pg (ref 26.0–34.0)
MCHC: 33.4 g/dL (ref 30.0–36.0)
MCV: 83.5 fL (ref 80.0–100.0)
Platelets: 269 10*3/uL (ref 150–400)
RBC: 5.16 MIL/uL (ref 4.22–5.81)
RDW: 12.7 % (ref 11.5–15.5)
WBC: 9.4 10*3/uL (ref 4.0–10.5)
nRBC: 0 % (ref 0.0–0.2)

## 2021-11-25 LAB — BASIC METABOLIC PANEL
Anion gap: 6 (ref 5–15)
BUN: 12 mg/dL (ref 6–20)
CO2: 25 mmol/L (ref 22–32)
Calcium: 9.3 mg/dL (ref 8.9–10.3)
Chloride: 101 mmol/L (ref 98–111)
Creatinine, Ser: 1.06 mg/dL (ref 0.61–1.24)
GFR, Estimated: >60 mL/min (ref >60)
Glucose, Bld: 306 mg/dL — ABNORMAL HIGH (ref 70–99)
Potassium: 4.4 mmol/L (ref 3.5–5.1)
Sodium: 132 mmol/L — ABNORMAL LOW (ref 135–145)

## 2021-11-25 LAB — URINALYSIS, ROUTINE W REFLEX MICROSCOPIC
Bacteria, UA: NONE SEEN
Bilirubin Urine: NEGATIVE
Glucose, UA: >=500 mg/dL — AB
Hgb urine dipstick: NEGATIVE
Ketones, ur: 5 mg/dL — AB
Nitrite: NEGATIVE
Protein, ur: NEGATIVE mg/dL
Specific Gravity, Urine: 1.032 — ABNORMAL HIGH (ref 1.005–1.030)
pH: 5 (ref 5.0–8.0)

## 2021-11-25 NOTE — ED Notes (Signed)
Se triage note   presents with headache  states he has had this h/a for couple of weeks   denies any fever or n/v  states h/a has been intermittent

## 2021-11-25 NOTE — ED Provider Notes (Signed)
[facility] Provider Note    Event Date/Time   First MD Initiated Contact with Patient 11/25/21 1645     (approximate)   History   Dizziness   HPI  Rodney Phillips is a 51 y.o. male   with a history of diabetes presents to the ER for evaluation of lightheadedness and dizziness as well as daily headaches for the past several days.  States he has not been drinking much fluid but does drink several cups of coffee every morning.  States that every day towards the mid of the day and afternoon his urine is very dark and concentrated.  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  Denies any sudden onset headaches no fevers.  No nausea or vomiting.  Denies any chest pain or pressure.      Physical Exam   Triage Vital Signs: ED Triage Vitals [11/25/21 1406]  Enc Vitals Group     BP 104/73     Pulse Rate (!) 106     Resp (!) 22     Temp 98.6 F (37 C)     Temp Source Oral     SpO2 95 %     Weight 220 lb (99.8 kg)     Height 6\' 2"  (1.88 m)     Head Circumference      Peak Flow      Pain Score 8     Pain Loc      Pain Edu?      Excl. in [place]?     Most recent vital signs: Vitals:   11/25/21 1406  BP: 104/73  Pulse: (!) 106  Resp: (!) 22  Temp: 98.6 F (37 C)  SpO2: 95%     Constitutional: Alert  Eyes: Conjunctivae are normal.  Head: Atraumatic. Nose: No congestion/rhinnorhea. Mouth/Throat: Mucous membranes are moist.   Neck: Painless ROM.  Cardiovascular:   Good peripheral circulation. Respiratory: Normal respiratory effort.  No retractions.  Gastrointestinal: Soft and nontender.  Musculoskeletal:  no deformity Neurologic:  CN- intact.  No facial droop, Normal FNF.  Normal heel to shin.  Sensation intact bilaterally. Normal speech and language. No gross focal neurologic deficits are appreciated. No gait  instability.  Skin:  Skin is warm, dry and intact. No rash noted. Psychiatric: Mood and affect are normal. Speech and behavior are normal.    ED Results / Procedures / Treatments   Labs (all labs ordered are listed, but only abnormal results are displayed) Labs Reviewed  BASIC METABOLIC PANEL - Abnormal; Notable for the following components:      Result Value   Sodium 132 (*)    Glucose, Bld 306 (*)    All other components within normal limits  URINALYSIS, ROUTINE W REFLEX MICROSCOPIC - Abnormal; Notable for the following components:   Color, Urine YELLOW (*)    APPearance HAZY (*)    Specific Gravity, Urine 1.032 (*)    Glucose, UA >=500 (*)    Ketones, ur 5 (*)    Leukocytes,Ua TRACE (*)    All other components within normal limits  CBC  CBG MONITORING, ED     EKG     RADIOLOGY Please see ED Course for my review and interpretation.  I personally reviewed all radiographic images ordered to evaluate for the above acute complaints and reviewed radiology reports and findings.  These findings were personally discussed with the patient.  Please see medical record for radiology report.    PROCEDURES:  Critical Care performed: No  Procedures   MEDICATIONS ORDERED IN ED: Medications - No data to display   IMPRESSION / MDM / [place] / ED COURSE  I reviewed the triage vital signs and the nursing notes.                              Differential diagnosis includes, but is not limited to, dehydration, electrolyte abn, anemia, sepsis, cva, mass, sah, orthostasis  Patient presenting with symptoms as described above that seem clinically most consistent with orthostasis.  CT imaging ordered out of triage by my review does not show any evidence of subdural hematoma and per radiology report is without any acute abnormality.  Does not seem consistent with subarachnoid.  Not consistent with CVA or TIA based on symptoms and reassuring exam.  He is mildly  hyperglycemic but no DKA.  I did recommend IV fluids but patient declining this stating he preferred to go home and increase his p.o. intake.         FINAL CLINICAL IMPRESSION(S) / ED DIAGNOSES   Final diagnoses:  Dizziness  Dehydration     Rx / DC Orders   ED Discharge Orders     None        Note:  This document was prepared using Dragon voice recognition software and may include unintentional dictation errors.    Merlyn Lot, MD 11/25/21 1728

## 2021-11-25 NOTE — ED Triage Notes (Signed)
Pt here with dizziness x3 weeks that was intermittent at first but has been constant. Pt also having headache pain, frontal when the dizziness occurs. Pt in NAD in triage.

## 2023-01-28 DIAGNOSIS — L02214 Cutaneous abscess of groin: Secondary | ICD-10-CM | POA: Diagnosis not present

## 2023-03-05 DIAGNOSIS — E785 Hyperlipidemia, unspecified: Secondary | ICD-10-CM | POA: Diagnosis not present

## 2023-03-05 DIAGNOSIS — Z125 Encounter for screening for malignant neoplasm of prostate: Secondary | ICD-10-CM | POA: Diagnosis not present

## 2023-03-05 DIAGNOSIS — Z79899 Other long term (current) drug therapy: Secondary | ICD-10-CM | POA: Diagnosis not present

## 2023-03-05 DIAGNOSIS — R7303 Prediabetes: Secondary | ICD-10-CM | POA: Diagnosis not present

## 2023-03-14 DIAGNOSIS — B029 Zoster without complications: Secondary | ICD-10-CM | POA: Diagnosis not present

## 2023-03-14 DIAGNOSIS — M542 Cervicalgia: Secondary | ICD-10-CM | POA: Diagnosis not present

## 2023-03-18 DIAGNOSIS — B029 Zoster without complications: Secondary | ICD-10-CM | POA: Diagnosis not present

## 2023-03-19 DIAGNOSIS — Z87891 Personal history of nicotine dependence: Secondary | ICD-10-CM | POA: Diagnosis not present

## 2023-03-22 DIAGNOSIS — G510 Bell's palsy: Secondary | ICD-10-CM | POA: Diagnosis not present

## 2023-04-06 DIAGNOSIS — K089 Disorder of teeth and supporting structures, unspecified: Secondary | ICD-10-CM | POA: Diagnosis not present

## 2023-04-06 DIAGNOSIS — R6884 Jaw pain: Secondary | ICD-10-CM | POA: Diagnosis not present

## 2023-04-06 DIAGNOSIS — K047 Periapical abscess without sinus: Secondary | ICD-10-CM | POA: Diagnosis not present

## 2023-05-01 DIAGNOSIS — H65192 Other acute nonsuppurative otitis media, left ear: Secondary | ICD-10-CM | POA: Diagnosis not present

## 2023-09-19 IMAGING — CT CT HEAD W/O CM
4 series · 16 of 47 positions shown, 18 images · non-contrast
Comparison: No pertinent prior exams available for comparison.

CLINICAL DATA: Provided history: Dizziness, nonspecific. Additional
history provided: Dizziness for 3 weeks, initially intermittent, now
constant. Headache.



[Series 2: head wo · axial · 0.44mm/px · z∈[+250,+380]mm · 7 of 36 slices shown, 9 images]
[im 5/36  brain]
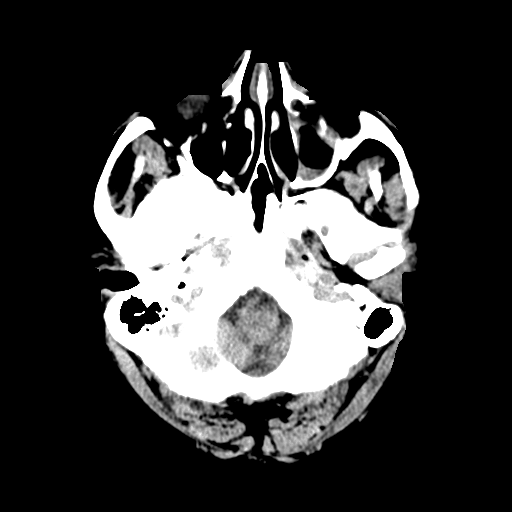
[im 5/36  bone]
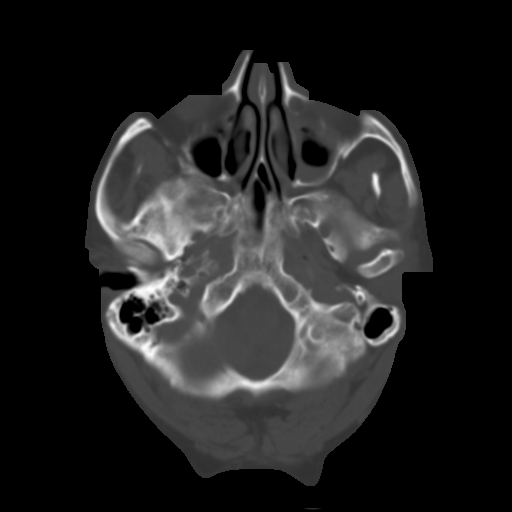
[im 9/36  brain]
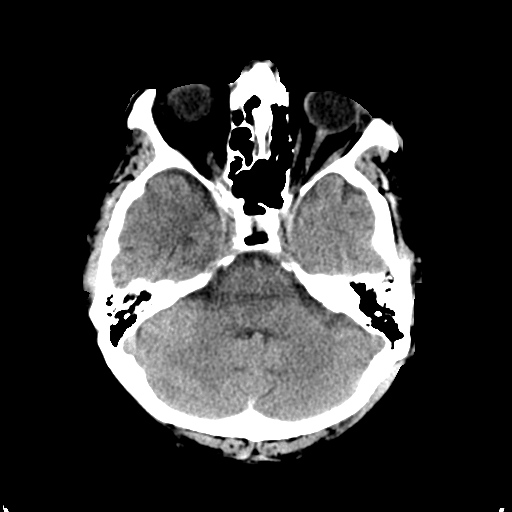
[im 14/36  brain]
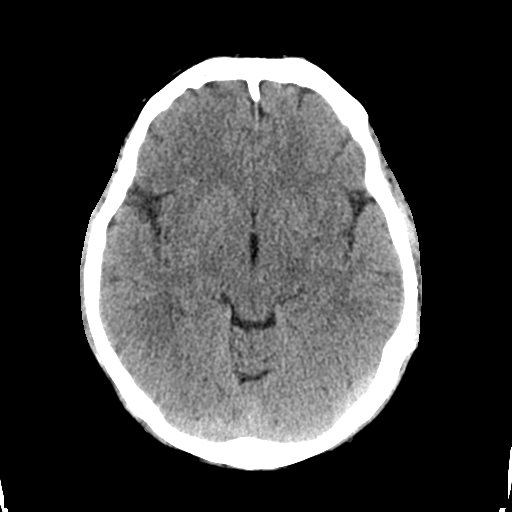
[im 18/36  brain]
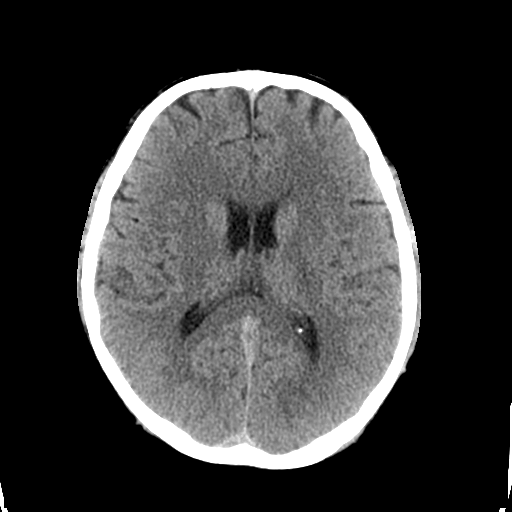
[im 22/36  brain]
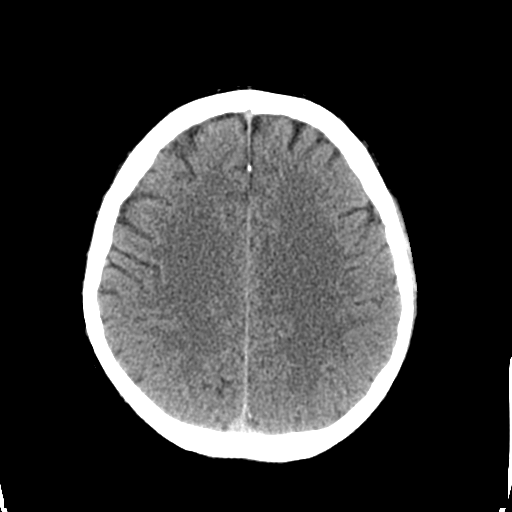
[im 22/36  bone]
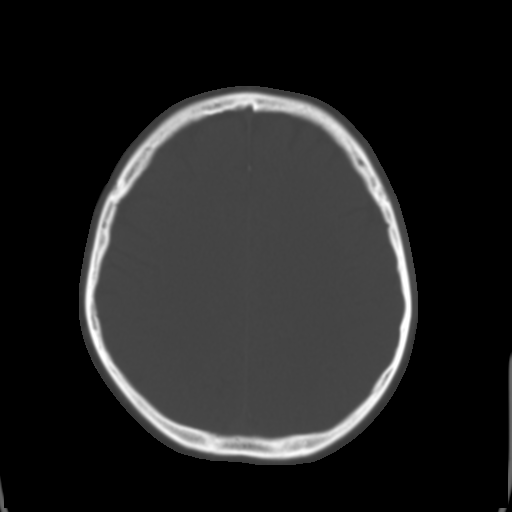
[im 27/36  brain]
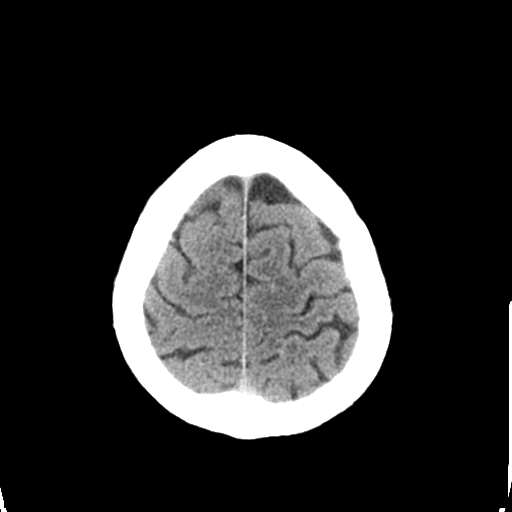
[im 31/36  brain]
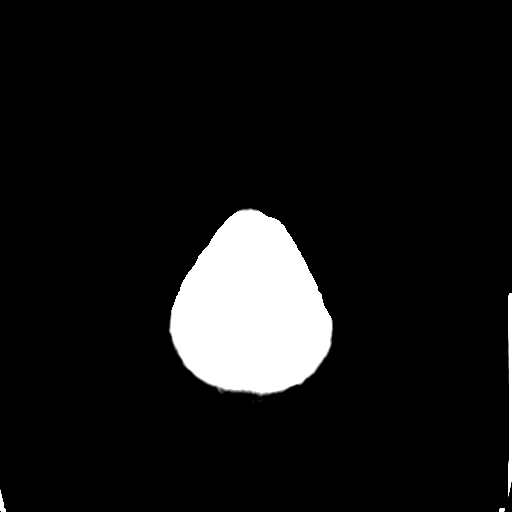

[Series 3: head bone · axial · 0.44mm/px · z∈[+246,+282]mm · 3 of 88 slices shown]
[im 9/88  bone]
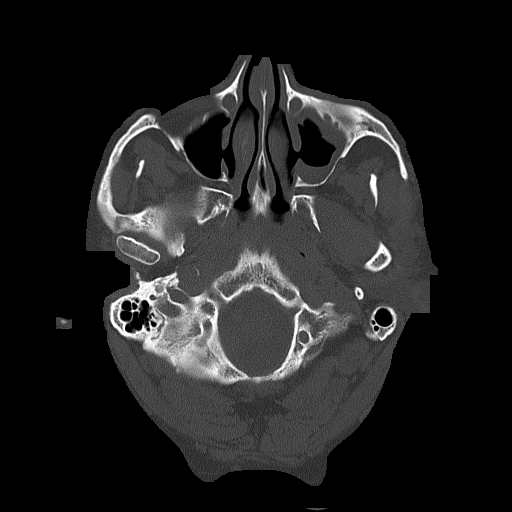
[im 18/88  bone]
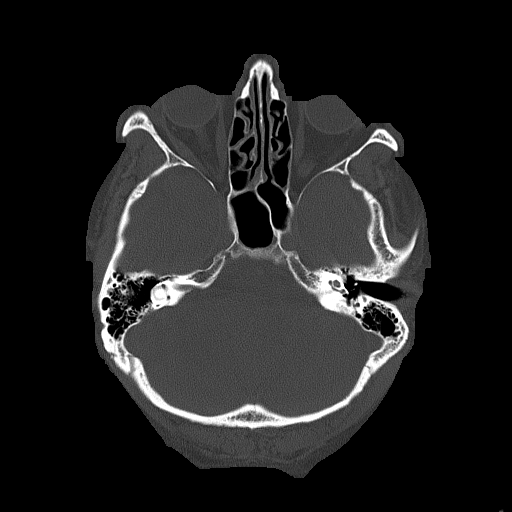
[im 27/88  bone]
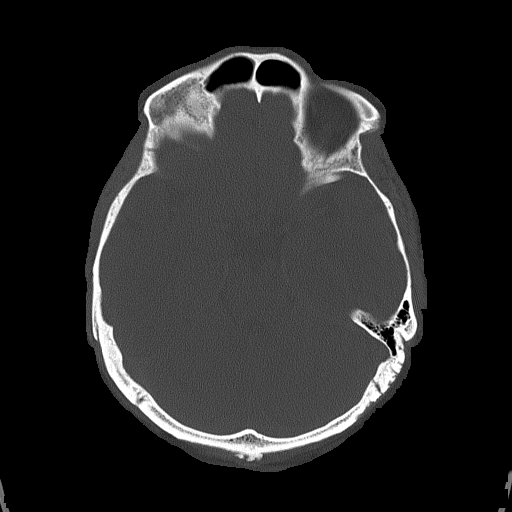

[Series 4: coronal soft tissue · coronal · 0.36mm/px · 3 of 77 slices shown]
[im 26/77  brain]
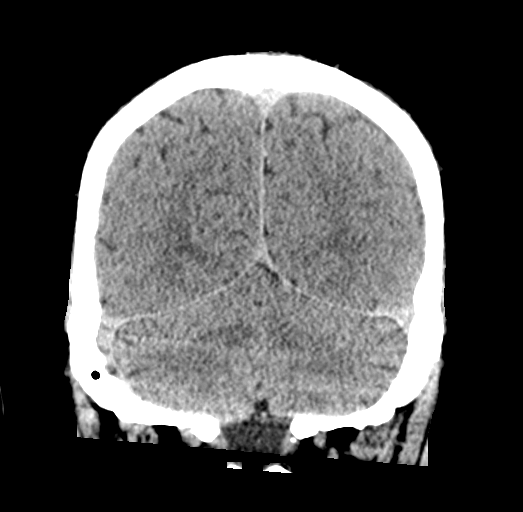
[im 34/77  brain]
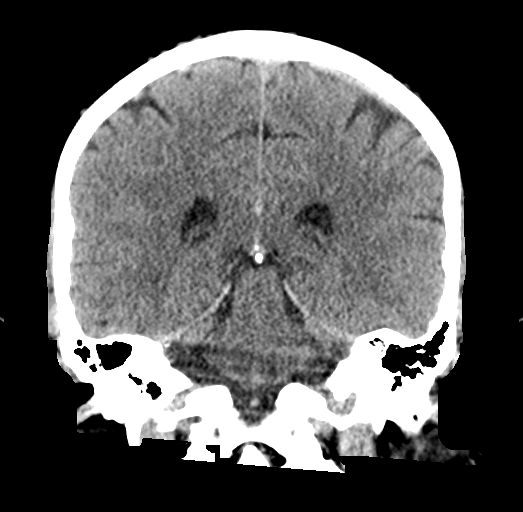
[im 43/77  brain]
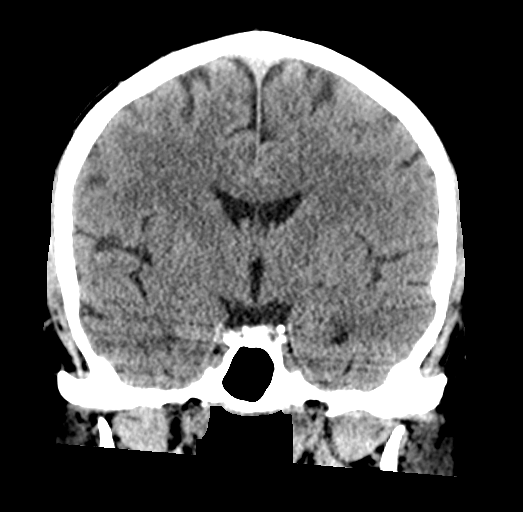

[Series 5: sagittal soft tissue · sagittal · 0.37mm/px · 3 of 60 slices shown]
[im 20/60  brain]
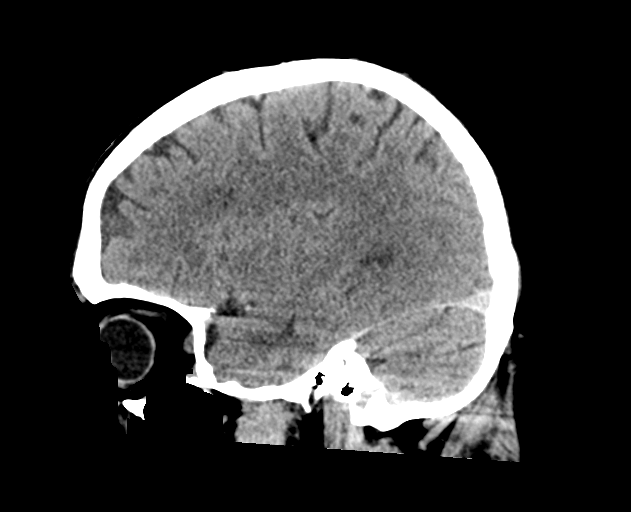
[im 30/60  brain]
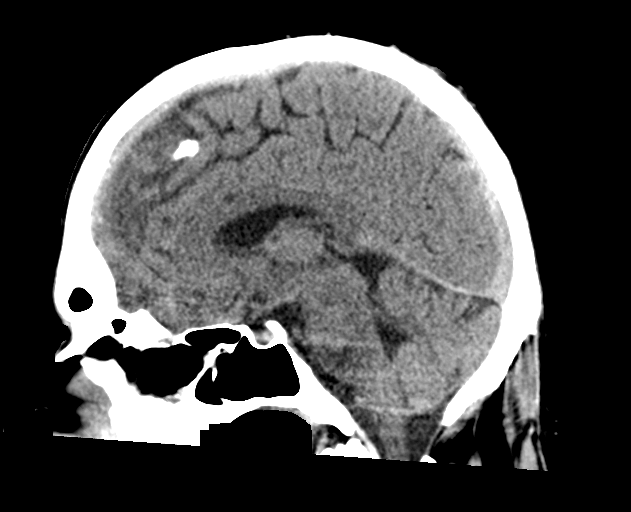
[im 40/60  brain]
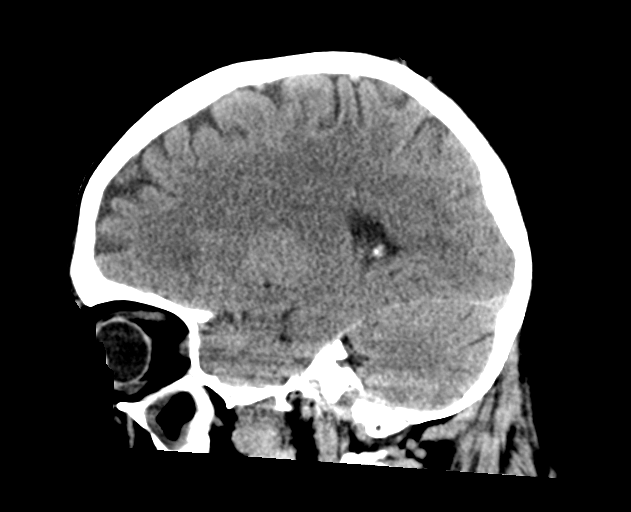

[16 of 47 positions shown; findings below may reference images not displayed]

FINDINGS: Brain:

Cerebral volume is normal.

There is no acute intracranial hemorrhage.

No demarcated cortical infarct.

No extra-axial fluid collection.

No evidence of an intracranial mass.

No midline shift.

Vascular: No hyperdense vessel.

Skull: Normal. Negative for fracture or focal lesion.

Sinuses/Orbits: Visualized orbits show no acute finding. Mild
mucosal thickening within the bilateral frontal, ethmoid and
sphenoid sinuses. Moderate mucosal thickening within the left
maxillary sinus at the imaged levels.
IMPRESSION: No evidence of acute intracranial abnormality.

Paranasal sinus disease at the imaged levels, as described.

## 2023-12-07 ENCOUNTER — Encounter (HOSPITAL_COMMUNITY): Payer: Self-pay

## 2023-12-07 ENCOUNTER — Other Ambulatory Visit: Payer: Self-pay

## 2023-12-07 ENCOUNTER — Emergency Department (HOSPITAL_COMMUNITY)

## 2023-12-07 ENCOUNTER — Emergency Department (HOSPITAL_COMMUNITY): Admission: EM | Admit: 2023-12-07 | Discharge: 2023-12-07 | Disposition: A | Discharge status: Home / Self Care

## 2023-12-07 DIAGNOSIS — J40 Bronchitis, not specified as acute or chronic: Secondary | ICD-10-CM | POA: Diagnosis not present

## 2023-12-07 DIAGNOSIS — R0602 Shortness of breath: Secondary | ICD-10-CM | POA: Diagnosis present

## 2023-12-07 DIAGNOSIS — F17200 Nicotine dependence, unspecified, uncomplicated: Secondary | ICD-10-CM | POA: Diagnosis not present

## 2023-12-07 DIAGNOSIS — E1165 Type 2 diabetes mellitus with hyperglycemia: Secondary | ICD-10-CM | POA: Insufficient documentation

## 2023-12-07 DIAGNOSIS — Z7984 Long term (current) use of oral hypoglycemic drugs: Secondary | ICD-10-CM | POA: Diagnosis not present

## 2023-12-07 HISTORY — DX: Anxiety disorder, unspecified: F41.9

## 2023-12-07 LAB — URINALYSIS, ROUTINE W REFLEX MICROSCOPIC
Bacteria, UA: NONE SEEN
Bilirubin Urine: NEGATIVE
Glucose, UA: >=500 mg/dL — AB
Hgb urine dipstick: NEGATIVE
Ketones, ur: 20 mg/dL — AB
Leukocytes,Ua: NEGATIVE
Nitrite: NEGATIVE
Protein, ur: NEGATIVE mg/dL
Specific Gravity, Urine: 1.032 — ABNORMAL HIGH (ref 1.005–1.030)
pH: 5 (ref 5.0–8.0)

## 2023-12-07 LAB — CBG MONITORING, ED
Glucose-Capillary: 555 mg/dL (ref 70–99)
Glucose-Capillary: 283 mg/dL — ABNORMAL HIGH (ref 70–99)

## 2023-12-07 LAB — CBC WITH DIFFERENTIAL/PLATELET
Abs Immature Granulocytes: 0.03 10*3/uL (ref 0.00–0.07)
Basophils Absolute: 0.1 10*3/uL (ref 0.0–0.1)
Basophils Relative: 1 %
Eosinophils Absolute: 0.3 10*3/uL (ref 0.0–0.5)
Eosinophils Relative: 4 %
HCT: 48.3 % (ref 39.0–52.0)
Hemoglobin: 16.7 g/dL (ref 13.0–17.0)
Immature Granulocytes: 0 %
Lymphocytes Relative: 18 %
Lymphs Abs: 1.5 10*3/uL (ref 0.7–4.0)
MCH: 28.1 pg (ref 26.0–34.0)
MCHC: 34.6 g/dL (ref 30.0–36.0)
MCV: 81.2 fL (ref 80.0–100.0)
Monocytes Absolute: 0.6 10*3/uL (ref 0.1–1.0)
Monocytes Relative: 7 %
Neutro Abs: 5.8 10*3/uL (ref 1.7–7.7)
Neutrophils Relative %: 70 %
Platelets: 224 10*3/uL (ref 150–400)
RBC: 5.95 MIL/uL — ABNORMAL HIGH (ref 4.22–5.81)
RDW: 13.7 % (ref 11.5–15.5)
WBC: 8.4 10*3/uL (ref 4.0–10.5)
nRBC: 0 % (ref 0.0–0.2)

## 2023-12-07 LAB — RESP PANEL BY RT-PCR (RSV, FLU A&B, COVID)  RVPGX2
Influenza A by PCR: NEGATIVE
Influenza B by PCR: NEGATIVE
Resp Syncytial Virus by PCR: NEGATIVE
SARS Coronavirus 2 by RT PCR: NEGATIVE

## 2023-12-07 LAB — BASIC METABOLIC PANEL
Anion gap: 15 (ref 5–15)
BUN: 13 mg/dL (ref 6–20)
CO2: 22 mmol/L (ref 22–32)
Calcium: 9.4 mg/dL (ref 8.9–10.3)
Chloride: 94 mmol/L — ABNORMAL LOW (ref 98–111)
Creatinine, Ser: 1.04 mg/dL (ref 0.61–1.24)
GFR, Estimated: >60 mL/min (ref >60)
Glucose, Bld: 437 mg/dL — ABNORMAL HIGH (ref 70–99)
Potassium: 4 mmol/L (ref 3.5–5.1)
Sodium: 131 mmol/L — ABNORMAL LOW (ref 135–145)

## 2023-12-07 LAB — D-DIMER, QUANTITATIVE: D-Dimer, Quant: 0.39 ug{FEU}/mL (ref 0.00–0.50)

## 2023-12-07 LAB — TROPONIN I (HIGH SENSITIVITY): Troponin I (High Sensitivity): 4 ng/L (ref <18)

## 2023-12-07 MED ORDER — GUAIFENESIN 100 MG/5ML PO LIQD
10.0000 mL | Freq: Once | ORAL | Status: AC
  Administered 2023-12-07: 10 mL via ORAL
  Filled 2023-12-07: qty 10

## 2023-12-07 MED ORDER — GUAIFENESIN 100 MG/5ML PO LIQD: 10.0000 mL | Freq: Four times a day (QID) | ORAL | 0 refills | Status: AC | PRN

## 2023-12-07 MED ORDER — AZITHROMYCIN 250 MG PO TABS: ORAL_TABLET | ORAL | 0 refills | Status: DC

## 2023-12-07 MED ORDER — LACTATED RINGERS IV BOLUS
20.0000 mL/kg | Freq: Once | INTRAVENOUS | Status: AC
  Administered 2023-12-07: 1996 mL via INTRAVENOUS

## 2023-12-07 NOTE — ED Triage Notes (Signed)
 Pt arrived via POV c/o body aches, chest pain, SOB, sinus congestion X 3 days.

## 2023-12-07 NOTE — Discharge Instructions (Addendum)
 As discussed, your labs showed you to have elevated blood sugar when he first came in.  Other than that your labs and imaging are reassuring.  I have sent in a Z-Pak to your pharmacy that you can take for bronchitis as well as Robitussin for cough.  Follow-up with your primary care provider within the next several days to reevaluate your elevated blood sugar.  Get help right away if: You cough up blood. You have chest pain. You have very bad shortness of breath. You faint or keep feeling like you are going to faint. You have a very bad headache. Your fever or chills get worse.

## 2023-12-07 NOTE — ED Provider Notes (Signed)
  EMERGENCY DEPARTMENT AT [facility] Provider Note   CSN: [id number] Arrival date & time: 12/07/23  1450     History  Chief Complaint  Patient presents with   Shortness of Breath    Rodney Phillips is a 53 y.o. male with a history of diabetes mellitus and smoking tobacco use who presents the ED today for shortness of breath.  Patient reports that he has been having shortness of breath, chest pain, sinus congestion, and bodyaches for the past 3 days.  Denies any fevers, nausea, vomiting, diarrhea, or abdominal pain.  Denies any known sick contact.  He has not been take anything for symptoms at home.    Home Medications Prior to Admission medications   Medication Sig Start Date End Date Taking? Authorizing Provider  Acetaminophen 325 MG CAPS Take 725 mg by mouth every 6 (six) hours as needed for mild pain (pain score 1-3).   Yes [provider]  azithromycin (ZITHROMAX Z-PAK) 250 MG tablet Take 2 tablets the first day, then 1 tablet for the next 4 days 12/07/23  Yes Maxwell Marion, PA-C  DM-Phenylephrine-Acetaminophen (VICKS DAYQUIL COLD & FLU) 10-5-325 MG CAPS Take 1 capsule by mouth 2 (two) times daily as needed (cough/cold/congestion).   Yes [provider]  glipiZIDE (GLUCOTROL) 5 MG tablet Take 5 mg by mouth daily before breakfast. 09/28/23  Yes [provider]  guaiFENesin (ROBITUSSIN) 100 MG/5ML liquid Take 10 mLs by mouth every 6 (six) hours as needed for up to 7 days for cough or to loosen phlegm. 12/07/23 12/14/23 Yes Maxwell Marion, PA-C  metFORMIN (GLUCOPHAGE) 500 MG tablet Take 500 mg by mouth 2 (two) times daily with a meal.   Yes [provider]  sertraline (ZOLOFT) 100 MG tablet Take 100 mg by mouth daily.   Yes [provider]      Allergies    Hydrocodone-acetaminophen and Vicodin [hydrocodone-acetaminophen]    Review of Systems   Review of Systems  Respiratory:  Positive for shortness of breath.   All other  systems reviewed and are negative.   Physical Exam Updated Vital Signs BP 121/85   Pulse 84   Temp 98.4 F (36.9 C) (Oral)   Resp 18   Ht 6\' 2"  (1.88 m)   Wt 99.8 kg   SpO2 95%   BMI 28.25 kg/m  Physical Exam Vitals and nursing note reviewed.  Constitutional:      General: He is not in acute distress.    Appearance: Normal appearance.  HENT:     Head: Normocephalic and atraumatic.     Mouth/Throat:     Mouth: Mucous membranes are moist.  Eyes:     Conjunctiva/sclera: Conjunctivae normal.     Pupils: Pupils are equal, round, and reactive to light.  Cardiovascular:     Rate and Rhythm: Normal rate and regular rhythm.     Pulses: Normal pulses.     Heart sounds: Normal heart sounds.  Pulmonary:     Effort: Pulmonary effort is normal.     Breath sounds: Normal breath sounds.     Comments: He is able to speak in full sentences. Oxygen saturation is 95% on room air Abdominal:     Palpations: Abdomen is soft.     Tenderness: There is no abdominal tenderness.  Musculoskeletal:        General: Normal range of motion.     Cervical back: Normal range of motion.  Skin:    General: Skin is warm  and dry.     Findings: No rash.  Neurological:     General: No focal deficit present.     Mental Status: He is alert.     Sensory: No sensory deficit.     Motor: No weakness.  Psychiatric:        Mood and Affect: Mood normal.        Behavior: Behavior normal.    ED Results / Procedures / Treatments   Labs (all labs ordered are listed, but only abnormal results are displayed) Labs Reviewed  CBC WITH DIFFERENTIAL/PLATELET - Abnormal; Notable for the following components:      Result Value   RBC 5.95 (*)    All other components within normal limits  BASIC METABOLIC PANEL - Abnormal; Notable for the following components:   Sodium 131 (*)    Chloride 94 (*)    Glucose, Bld 437 (*)    All other components within normal limits  URINALYSIS, ROUTINE W REFLEX MICROSCOPIC - Abnormal;  Notable for the following components:   Color, Urine STRAW (*)    Specific Gravity, Urine 1.032 (*)    Glucose, UA >=500 (*)    Ketones, ur 20 (*)    All other components within normal limits  CBG MONITORING, ED - Abnormal; Notable for the following components:   Glucose-Capillary 555 (*)    All other components within normal limits  CBG MONITORING, ED - Abnormal; Notable for the following components:   Glucose-Capillary 283 (*)    All other components within normal limits  RESP PANEL BY RT-PCR (RSV, FLU A&B, COVID)  RVPGX2  D-DIMER, QUANTITATIVE  TROPONIN I (HIGH SENSITIVITY)    EKG EKG Interpretation Date/Time:  Monday December 07 2023 15:46:51 EST Ventricular Rate:  117 PR Interval:  144 QRS Duration:  82 QT Interval:  318 QTC Calculation: 443 R Axis:   116  Text Interpretation: Sinus tachycardia Biatrial enlargement Left posterior fascicular block Abnormal ECG When compared with ECG of 22-Jan-2014 20:38, Left posterior fascicular block is now Present Confirmed by Beckey Downing [phone]) on 12/07/2023 4:36:56 PM  Radiology DG Chest 2 View Result Date: 12/07/2023 CLINICAL DATA:  Chest pain with body aches and shortness of breath. EXAM: CHEST - 2 VIEW COMPARISON:  January 01, 2016 FINDINGS: The heart size and mediastinal contours are within normal limits. Both lungs are clear. Multilevel degenerative changes are noted throughout the thoracic spine. IMPRESSION: No active cardiopulmonary disease. Electronically Signed   By: Aram Candela M.D.   On: 12/07/2023 18:22    Procedures Procedures: not indicated.   Medications Ordered in ED Medications  lactated ringers bolus 1,996 mL (0 mLs Intravenous Stopped 12/07/23 1850)  guaiFENesin (ROBITUSSIN) 100 MG/5ML liquid 10 mL (10 mLs Oral Given 12/07/23 1900)    ED Course/ Medical Decision Making/ A&P                                 Medical Decision Making Amount and/or Complexity of Data Reviewed Labs: ordered. Radiology:  ordered.  Risk OTC drugs. Prescription drug management.   This patient presents to the ED for concern of shortness of breath, this involves an extensive number of treatment options, and is a complaint that carries with it a high risk of complications and morbidity.   Differential diagnosis includes: ACS, pulmonary embolism, flu, COVID, RSV, pneumonia, bronchitis, other viral illness, hyperglycemia, HHS, DKA, etc.   Comorbidities  See HPI above   Additional  History  Additional history obtained from prior records   Cardiac Monitoring / EKG  The patient was maintained on a cardiac monitor.  I personally viewed and interpreted the cardiac monitored which showed: Sinus tachycardia with a heart rate of 117 bpm.   Lab Tests  I ordered and personally interpreted labs.  The pertinent results include:   Negative troponin Negative D-dimer Initial CBG of 555 - patient denies any increased urination, hunger, or thirst.  No abdominal pain, nausea, or vomiting.   Imaging Studies  I ordered imaging studies including CXR  I independently visualized and interpreted imaging which showed: No active cardiopulmonary disease. I agree with the radiologist interpretation   Problem List / ED Course / Critical Interventions / Medication Management  Patient presents the ED today for shortness of breath, cough, body aches, congestion for the past 3 days.  Denies any fevers, nausea, vomiting, diarrhea, abdominal pain.  No known sick contact. Patient has elevated blood sugar in the 500s in triage.  Patient denies abdominal, nausea, or vomiting.  No polydipsia, polyuria, or polyphasia.  Low suspicion for DKA. I ordered medications including: 1L lactated ringers for hyperglycemia Robitussin for cough  Reevaluation of the patient after these medicines showed that the patient improved.  Repeat CBG 287. I have reviewed the patients home medicines and have made adjustments as needed. Discussed finds  with patient.  All questions were answered.   Social Determinants of Health  Tobacco use   Test / Admission - Considered  Patient is stable and safe for discharge home. Return precautions provided.       Final Clinical Impression(s) / ED Diagnoses Final diagnoses:  Bronchitis    Rx / DC Orders ED Discharge Orders          Ordered    guaiFENesin (ROBITUSSIN) 100 MG/5ML liquid  Every 6 hours PRN        12/07/23 2115    azithromycin (ZITHROMAX Z-PAK) 250 MG tablet        12/07/23 2115              Maxwell Marion, PA-C 12/07/23 2126    Durwin Glaze, MD 12/08/23 [phone]

## 2023-12-07 NOTE — ED Provider Triage Note (Signed)
 Emergency Medicine Provider Triage Evaluation Note  Rodney Phillips , a 53 y.o. male  was evaluated in triage.  Pt complains of chest pressure, SHOB, sinus pressure, productive cough, myalgia for past 3 days. Chest pressure and SHOB worsen with exertion. No pedal edema, NVD. Hx of tobacco abuse  Review of Systems  Positive:  chest pressure, SHOB, sinus pressure, productive cough, myalgia Negative: No pedal edema, NVD  Physical Exam  BP 101/77 (BP Location: Right Arm)   Pulse (!) 124   Temp 98.8 F (37.1 C) (Temporal)   Resp 18   Ht 6\' 2"  (1.88 m)   Wt 99.8 kg   SpO2 97%   BMI 28.25 kg/m  Gen:   Awake, no distress   Resp:  Normal effort  MSK:   Moves extremities without difficulty Other:  No pedal edema  Medical Decision Making  Medically screening exam initiated at 4:02 PM.  Appropriate orders placed.  Rodney Phillips was informed that the remainder of the evaluation will be completed by another provider, this initial triage assessment does not replace that evaluation, and the importance of remaining in the ED until their evaluation is complete.  Labs, resp panel, CXR ordered   Judithann Sheen, [place] 12/07/23 6578

## 2023-12-17 ENCOUNTER — Other Ambulatory Visit: Payer: Self-pay

## 2023-12-17 ENCOUNTER — Emergency Department (HOSPITAL_COMMUNITY)
Admission: EM | Admit: 2023-12-17 | Discharge: 2023-12-17 | Disposition: A | Discharge status: Home / Self Care | Attending: Emergency Medicine | Admitting: Emergency Medicine

## 2023-12-17 ENCOUNTER — Telehealth (HOSPITAL_COMMUNITY): Payer: Self-pay | Admitting: Pharmacy Technician

## 2023-12-17 ENCOUNTER — Other Ambulatory Visit (HOSPITAL_COMMUNITY): Payer: Self-pay

## 2023-12-17 ENCOUNTER — Encounter (HOSPITAL_COMMUNITY): Payer: Self-pay

## 2023-12-17 ENCOUNTER — Emergency Department (HOSPITAL_COMMUNITY)

## 2023-12-17 DIAGNOSIS — Z7984 Long term (current) use of oral hypoglycemic drugs: Secondary | ICD-10-CM | POA: Diagnosis not present

## 2023-12-17 DIAGNOSIS — Z794 Long term (current) use of insulin: Secondary | ICD-10-CM | POA: Insufficient documentation

## 2023-12-17 DIAGNOSIS — R079 Chest pain, unspecified: Secondary | ICD-10-CM | POA: Insufficient documentation

## 2023-12-17 DIAGNOSIS — Z79899 Other long term (current) drug therapy: Secondary | ICD-10-CM | POA: Insufficient documentation

## 2023-12-17 DIAGNOSIS — E1165 Type 2 diabetes mellitus with hyperglycemia: Secondary | ICD-10-CM | POA: Insufficient documentation

## 2023-12-17 DIAGNOSIS — R739 Hyperglycemia, unspecified: Secondary | ICD-10-CM

## 2023-12-17 LAB — CBC WITH DIFFERENTIAL/PLATELET
Abs Immature Granulocytes: 0.02 10*3/uL (ref 0.00–0.07)
Basophils Absolute: 0.1 10*3/uL (ref 0.0–0.1)
Basophils Relative: 1 %
Eosinophils Absolute: 0.5 10*3/uL (ref 0.0–0.5)
Eosinophils Relative: 7 %
HCT: 44.4 % (ref 39.0–52.0)
Hemoglobin: 15.2 g/dL (ref 13.0–17.0)
Immature Granulocytes: 0 %
Lymphocytes Relative: 28 %
Lymphs Abs: 2 10*3/uL (ref 0.7–4.0)
MCH: 28 pg (ref 26.0–34.0)
MCHC: 34.2 g/dL (ref 30.0–36.0)
MCV: 81.9 fL (ref 80.0–100.0)
Monocytes Absolute: 0.6 10*3/uL (ref 0.1–1.0)
Monocytes Relative: 8 %
Neutro Abs: 4 10*3/uL (ref 1.7–7.7)
Neutrophils Relative %: 56 %
Platelets: 248 10*3/uL (ref 150–400)
RBC: 5.42 MIL/uL (ref 4.22–5.81)
RDW: 13.9 % (ref 11.5–15.5)
WBC: 7.2 10*3/uL (ref 4.0–10.5)
nRBC: 0 % (ref 0.0–0.2)

## 2023-12-17 LAB — URINALYSIS, ROUTINE W REFLEX MICROSCOPIC
Bacteria, UA: NONE SEEN
Bilirubin Urine: NEGATIVE
Glucose, UA: >=500 mg/dL — AB
Hgb urine dipstick: NEGATIVE
Ketones, ur: 20 mg/dL — AB
Leukocytes,Ua: NEGATIVE
Nitrite: NEGATIVE
Protein, ur: NEGATIVE mg/dL
Specific Gravity, Urine: 1.035 — ABNORMAL HIGH (ref 1.005–1.030)
pH: 5 (ref 5.0–8.0)

## 2023-12-17 LAB — COMPREHENSIVE METABOLIC PANEL
ALT: 15 U/L (ref 0–44)
AST: 17 U/L (ref 15–41)
Albumin: 3.8 g/dL (ref 3.5–5.0)
Alkaline Phosphatase: 84 U/L (ref 38–126)
Anion gap: 15 (ref 5–15)
BUN: 15 mg/dL (ref 6–20)
CO2: 20 mmol/L — ABNORMAL LOW (ref 22–32)
Calcium: 9.3 mg/dL (ref 8.9–10.3)
Chloride: 98 mmol/L (ref 98–111)
Creatinine, Ser: 0.95 mg/dL (ref 0.61–1.24)
GFR, Estimated: >60 mL/min (ref >60)
Glucose, Bld: 352 mg/dL — ABNORMAL HIGH (ref 70–99)
Potassium: 4 mmol/L (ref 3.5–5.1)
Sodium: 133 mmol/L — ABNORMAL LOW (ref 135–145)
Total Bilirubin: 1 mg/dL (ref 0.0–1.2)
Total Protein: 7 g/dL (ref 6.5–8.1)

## 2023-12-17 LAB — TROPONIN I (HIGH SENSITIVITY): Troponin I (High Sensitivity): 3 ng/L (ref <18)

## 2023-12-17 LAB — CBG MONITORING, ED
Glucose-Capillary: 313 mg/dL — ABNORMAL HIGH (ref 70–99)
Glucose-Capillary: 309 mg/dL — ABNORMAL HIGH (ref 70–99)

## 2023-12-17 MED ORDER — INSULIN PEN NEEDLE 32G X 4 MM MISC: 1.0000 | Freq: Three times a day (TID) | 2 refills | Status: AC

## 2023-12-17 MED ORDER — LANCET DEVICE MISC: 1.0000 | Freq: Three times a day (TID) | 0 refills | Status: AC

## 2023-12-17 MED ORDER — LANCETS MISC. MISC: 1.0000 | Freq: Three times a day (TID) | 0 refills | Status: AC

## 2023-12-17 MED ORDER — LIVING WELL WITH DIABETES BOOK
Freq: Once | Status: DC
  Filled 2023-12-17: qty 1

## 2023-12-17 MED ORDER — BLOOD GLUCOSE TEST VI STRP
1.0000 | ORAL_STRIP | Freq: Three times a day (TID) | 0 refills | Status: AC
Start: 2023-12-17 — End: 2024-01-16

## 2023-12-17 MED ORDER — INSULIN ASPART 100 UNIT/ML IJ SOLN
4.0000 [IU] | INTRAMUSCULAR | Status: AC
  Administered 2023-12-17: 4 [IU] via SUBCUTANEOUS
  Filled 2023-12-17: qty 1

## 2023-12-17 MED ORDER — LANTUS SOLOSTAR 100 UNIT/ML ~~LOC~~ SOPN: 15.0000 [IU] | PEN_INJECTOR | Freq: Every day | SUBCUTANEOUS | 11 refills | Status: AC

## 2023-12-17 MED ORDER — INSULIN STARTER KIT- PEN NEEDLES (ENGLISH)
1.0000 | Freq: Once | Status: DC
  Filled 2023-12-17: qty 1

## 2023-12-17 MED ORDER — BLOOD GLUCOSE MONITORING SUPPL DEVI: 1.0000 | Freq: Three times a day (TID) | 0 refills | Status: AC

## 2023-12-17 MED ORDER — LACTATED RINGERS IV BOLUS
1000.0000 mL | Freq: Once | INTRAVENOUS | Status: AC
  Administered 2023-12-17: 1000 mL via INTRAVENOUS

## 2023-12-17 MED ORDER — NOVOLOG FLEXPEN 100 UNIT/ML ~~LOC~~ SOPN: 10.0000 [IU] | PEN_INJECTOR | Freq: Three times a day (TID) | SUBCUTANEOUS | 11 refills | Status: AC

## 2023-12-17 NOTE — Discharge Instructions (Addendum)
 Would recommend discharging on Lantus 15 units Q24H (#60454) and Novolog 0-9 units TID with meals (#098119).  Novolog scale 0-9 units TID:  If CBG <150 mg/dl 0 units  147-829 1 units  201-250 3 units  251-300 5 units  301-350 7 units  351 or higher 9 units   Discontinue Glipizide and continue Metformin 1000 mg BID. Please provide Rx for glucose monitoring kit (#[id number]) and insulin pen needles [phone]).   Please find a local PCP in [place] area where you are living now. If you are not able to find PCP right away, consider calling [facility] to see if you can be seen for diabetes management since you are being newly discharged on insulin.  [facility] [address] [place], [place] [address] [phone]

## 2023-12-17 NOTE — ED Triage Notes (Signed)
 Pt arrived via POV c/o left chest pain X 1 week. Pt seen here recently for similar complaint. Pt reports finishing prescribed Z-Pack for bronchitis. Pt endorses weakness, and cough.

## 2023-12-17 NOTE — Telephone Encounter (Signed)
 Patient Product/process development scientist completed.    The patient is insured through [facility] MEDICAID.     Ran test claim for Lantus Pen and the current 30 day co-pay is $4.00.  Ran test claim for Novolog FlexPen and the current 30 day co-pay is $4.00.  This test claim was processed through [facility]- copay amounts may vary at other pharmacies due to pharmacy/plan contracts, or as the patient moves through the different stages of their insurance plan.     Roland Earl, CPHT Pharmacy Technician III Certified Patient Advocate [facility] Pharmacy Patient Advocate Team Direct Number: [phone]  Fax: [phone]

## 2023-12-17 NOTE — ED Notes (Signed)
 CSW was consulted for patient not having a PCP . CSW added some [place], [place] PCP to AVS that accepts his insurance and instructed for patient to call to see if they are accepting new patients. TOC will clear consult.

## 2023-12-17 NOTE — Inpatient Diabetes Management (Addendum)
 Inpatient Diabetes Program Recommendations  AACE/ADA: New Consensus Statement on Inpatient Glycemic Control   Target Ranges:  Prepandial:   less than 140 mg/dL      Peak postprandial:   less than 180 mg/dL (1-2 hours)      Critically ill patients:  140 - 180 mg/dL    Latest Reference Range & Units 12/17/23 12:18  Glucose-Capillary 70 - 99 mg/dL 161 (H)    Latest Reference Range & Units 12/07/23 17:24 12/17/23 11:45  Glucose 70 - 99 mg/dL 096 (H) 045 (H)    Review of Glycemic Control  Diabetes history: DM2 Outpatient Diabetes medications: Glipizide 5 mg BID, Metformin 1000 mg BID Current orders for Inpatient glycemic control: NA; in ED  Inpatient Diabetes Program Recommendations:    Outpatient DM: Would recommend discharging on Lantus 15 units Q24H (#40981) and Novolog 0-9 units TID with meals (#[id number]).  Novolog scale 0-9 units TID If CBG <150 mg/dl  0 units 295-621                   1 units 201-250                   3 units 251-300                   5 units 301-350                   7 units 351 or higher           9 units  Would also recommend to discontinue Glipizide and continue Metformin 1000 mg BID. Please provide Rx for glucose monitoring kit (#[id number]) and insulin pen needles [phone]).  NOTE: Spoke with patient at bedside about diabetes and home regimen for diabetes control. Patient reports he has [place] [employer] (from [place], [place]) and that he moved to [place] several months ago. Patient does not have a PCP in [place] area yet. Patient reports that he has been taking Glipizide 5 mg BID and Metformin 1000 mg BID consistently. He was checking glucose at home until his glucometer stopped working. Patient states that his glucose was over 500 mg/dl when  he came to the ED on 12/07/23. Discussed lab glucose of 352 mg/dl today. Patient confirms he has been having symptoms of hyperglycemia. Patient states that he took insulin several years ago (used insulin pens).   Discussed that  I noted A1C of >15.5% on 03/05/23 in Care Everywhere.  Patient was not aware of A1C or what an A1C is. Explained what an A1C is and discussed glucose and A1C goals. Discussed importance of checking CBGs and maintaining good CBG control to prevent long-term and short-term complications. Explained how hyperglycemia leads to damage within blood vessels which lead to the common complications seen with uncontrolled diabetes. Stressed to the patient the importance of improving glycemic control to prevent further complications from uncontrolled diabetes. Discussed impact of nutrition, exercise, stress, sickness, and medications on diabetes control.  Discussed carbohydrates, carbohydrate goals per day and meal, along with portion sizes. Patient states that over the past 1-2 years he has lost about 100 pounds (unintentionally). Discussed how poorly controlled DM can contribute to weight loss.   Reviewed and demonstrated insulin pen and insulin injection sites. Patient was able to successfully demonstrate how to use an insulin pen. Patient states that he does not remember the name of the insulin he used in the past. Discussed long and short acting insulin; discussed how to use an insulin correction scale  based on glucose.  Reviewed hypoglycemia along with treatment.   Patient verbalized understanding of information discussed and reports no further questions at this time related to diabetes.  Thanks, Orlando Penner, RN, MSN, CDE Diabetes Coordinator Inpatient Diabetes Program [phone] (Team Pager)

## 2023-12-17 NOTE — ED Provider Notes (Signed)
 [place] EMERGENCY DEPARTMENT AT [facility] Provider Note   CSN: [id number] Arrival date & time: 12/17/23  1115     History {Add pertinent medical, surgical, social history, OB history to HPI:1} Chief Complaint  Patient presents with   Chest Pain    Rodney Phillips is a 53 y.o. male.  53 year old male with a history of diabetes on metformin and glipizide who presents emergency department with generalized fatigue.  Was seen here on 3/3 for chest pressure and URI symptoms.  Was found to be hyperglycemic with a blood sugar in the 500s.  Given fluids and his blood sugar improved.  He had a D-dimer, troponin, COVID and flu, and chest x-ray that did not show acute findings.  Says that since going home he finished a Z-Pak and says he still having a mild productive cough and generalized weakness.  Unsure of what his blood sugars are because he has not been able to check them at home due to faulty blood sugar monitor.  Also says that he has had persistent left-sided chest pressure that is mild in severity.  Not exertional.  Not pleuritic.  No diaphoresis or vomiting.  No personal history of MI.       Home Medications Prior to Admission medications   Medication Sig Start Date End Date Taking? Authorizing Provider  Acetaminophen 325 MG CAPS Take 725 mg by mouth every 6 (six) hours as needed for mild pain (pain score 1-3).   Yes [provider]  DM-Phenylephrine-Acetaminophen (VICKS DAYQUIL COLD & FLU) 10-5-325 MG CAPS Take 1 capsule by mouth 2 (two) times daily as needed (cough/cold/congestion).   Yes [provider]  glipiZIDE (GLUCOTROL) 5 MG tablet Take 5 mg by mouth daily before breakfast. 09/28/23  Yes [provider]  metFORMIN (GLUCOPHAGE) 500 MG tablet Take 500 mg by mouth 2 (two) times daily with a meal.   Yes [provider]  sertraline (ZOLOFT) 100 MG tablet Take 100 mg by mouth daily.   Yes [provider]      Allergies     Hydrocodone-acetaminophen and Vicodin [hydrocodone-acetaminophen]    Review of Systems   Review of Systems  Physical Exam Updated Vital Signs BP 115/85   Pulse 90   Temp 97.7 F (36.5 C) (Oral)   Resp 19   Ht 6\' 2"  (1.88 m)   Wt 99.8 kg   SpO2 95%   BMI 28.25 kg/m  Physical Exam Vitals and nursing note reviewed.  Constitutional:      General: He is not in acute distress.    Appearance: He is well-developed.  HENT:     Head: Normocephalic and atraumatic.     Right Ear: External ear normal.     Left Ear: External ear normal.     Nose: Nose normal.  Eyes:     Extraocular Movements: Extraocular movements intact.     Conjunctiva/sclera: Conjunctivae normal.     Pupils: Pupils are equal, round, and reactive to light.  Cardiovascular:     Rate and Rhythm: Normal rate and regular rhythm.     Heart sounds: Normal heart sounds.     Comments: Chest pain not reproducible Pulmonary:     Effort: Pulmonary effort is normal. No respiratory distress.     Breath sounds: Normal breath sounds.  Musculoskeletal:     Cervical back: Normal range of motion and neck supple.  Skin:    General: Skin is warm and dry.  Neurological:     Mental  Status: He is alert. Mental status is at baseline.  Psychiatric:        Mood and Affect: Mood normal.        Behavior: Behavior normal.     ED Results / Procedures / Treatments   Labs (all labs ordered are listed, but only abnormal results are displayed) Labs Reviewed  COMPREHENSIVE METABOLIC PANEL - Abnormal; Notable for the following components:      Result Value   Sodium 133 (*)    CO2 20 (*)    Glucose, Bld 352 (*)    All other components within normal limits  CBG MONITORING, ED - Abnormal; Notable for the following components:   Glucose-Capillary 313 (*)    All other components within normal limits  CBC WITH DIFFERENTIAL/PLATELET  TROPONIN I (HIGH SENSITIVITY)    EKG None  Radiology No results found.  Procedures Procedures   {Document cardiac monitor, telemetry assessment procedure when appropriate:1}  Medications Ordered in ED Medications  lactated ringers bolus 1,000 mL (1,000 mLs Intravenous New Bag/Given 12/17/23 1222)    ED Course/ Medical Decision Making/ A&P   {   Click here for ABCD2, HEART and other calculatorsREFRESH Note before signing :1}                              Medical Decision Making Amount and/or Complexity of Data Reviewed Labs: ordered. Radiology: ordered.   ***  {Document critical care time when appropriate:1} {Document review of labs and clinical decision tools ie heart score, Chads2Vasc2 etc:1}  {Document your independent review of radiology images, and any outside records:1} {Document your discussion with family members, caretakers, and with consultants:1} {Document social determinants of health affecting pt's care:1} {Document your decision making why or why not admission, treatments were needed:1} Final Clinical Impression(s) / ED Diagnoses Final diagnoses:  None    Rx / DC Orders ED Discharge Orders     None

## 2024-05-18 ENCOUNTER — Encounter: Payer: Self-pay | Admitting: Cardiology

## 2024-05-18 ENCOUNTER — Ambulatory Visit: Attending: Cardiology | Admitting: Cardiology

## 2024-05-18 VITALS — BP 118/73 | Ht 74.0 in | Wt 220.0 lb

## 2024-05-18 DIAGNOSIS — R42 Dizziness and giddiness: Secondary | ICD-10-CM

## 2024-05-18 DIAGNOSIS — F17200 Nicotine dependence, unspecified, uncomplicated: Secondary | ICD-10-CM

## 2024-05-18 NOTE — Progress Notes (Signed)
 Cardiology Office Note:    Date:  05/18/2024   ID:  Rodney Phillips, DOB [birth year], MRN [id number]  PCP:  Autry Grayce LABOR, PA   [facility] Providers Cardiologist:  None     Referring MD: Autry Grayce LABOR, PA   Chief Complaint  Patient presents with   Dizziness    Patient states that he was having dizzy spells, but is not having them any longer once he started one of his medication. Patient is doing well on today. Meds reviewed.     History of Present Illness:    Rodney Phillips is a 53 y.o. male with a hx of diabetes, current smoker x 30 years who presents with dizziness.  States having symptoms of dizziness, lightheadedness, presyncope occurring approximately 3 months ago.  Was diagnosed with diabetes 2 years ago.  Was started on metformin, glipizide.  He followed up with his primary care physician after dizziness onset, diabetes medicines were adjusted.  Glipizide was stopped, insulin  was started.  Symptoms resolved after medicine changes.  Denies any current dizziness.  Denies syncope.  Denies palpitations, chest pain, shortness of breath, family history of heart disease.  Past Medical History:  Diagnosis Date   Anxiety    Diabetes mellitus without complication (HCC)     Past Surgical History:  Procedure Laterality Date   APPENDECTOMY      Current Medications: Current Meds  Medication Sig   Acetaminophen 325 MG CAPS Take 725 mg by mouth every 6 (six) hours as needed for mild pain (pain score 1-3).   Blood Glucose Monitoring Suppl DEVI 1 each by Does not apply route in the morning, at noon, and at bedtime. May substitute to any manufacturer covered by patient's insurance.   DM-Phenylephrine-Acetaminophen (VICKS DAYQUIL COLD & FLU) 10-5-325 MG CAPS Take 1 capsule by mouth 2 (two) times daily as needed (cough/cold/congestion).   glipiZIDE (GLUCOTROL) 5 MG tablet Take 5 mg by mouth daily before breakfast.   insulin  aspart (NOVOLOG  FLEXPEN) 100 UNIT/ML FlexPen  Inject 10 Units into the skin 3 (three) times daily with meals. Novolog  scale 0-9 units TID: If CBG <150 mg/dl 0 units, 848-799 1 units, 201-250 3 units, 251-300 5 units, 301-350 7 units, 351 or higher 9 units   insulin  glargine (LANTUS  SOLOSTAR) 100 UNIT/ML Solostar Pen Inject 15 Units into the skin daily.   metFORMIN (GLUCOPHAGE) 500 MG tablet Take 500 mg by mouth 2 (two) times daily with a meal.   sertraline (ZOLOFT) 100 MG tablet Take 100 mg by mouth daily.     Allergies:   Hydrocodone-acetaminophen and Vicodin [hydrocodone-acetaminophen]   Social History   Socioeconomic History   Marital status: Married    Spouse name: Not on file   Number of children: Not on file   Years of education: Not on file   Highest education level: Not on file  Occupational History   Not on file  Tobacco Use   Smoking status: Every Day    Current packs/day: 1.00    Types: Cigarettes   Smokeless tobacco: Never  Vaping Use   Vaping status: Never Used  Substance and Sexual Activity   Alcohol use: Yes    Comment: occasionally   Drug use: Not Currently   Sexual activity: Not Currently  Other Topics Concern   Not on file  Social History Narrative   Not on file   Social Drivers of Health   Financial Resource Strain: Not on file  Food Insecurity: Not on file  Transportation  Needs: Not on file  Physical Activity: Not on file  Stress: Not on file  Social Connections: Not on file     Family History: The patient's family history is not on file.  ROS:   Please see the history of present illness.     All other systems reviewed and are negative.  EKGs/Labs/Other Studies Reviewed:    The following studies were reviewed today:  EKG Interpretation Date/Time:  Wednesday May 18 2024 08:15:36 EDT Ventricular Rate:  82 PR Interval:  156 QRS Duration:  78 QT Interval:  364 QTC Calculation: 425 R Axis:   57  Text Interpretation: Normal sinus rhythm Normal ECG Confirmed by Darliss Rogue  [phone]) on 05/18/2024 8:35:12 AM    Recent Labs: 12/17/2023: ALT 15; BUN 15; Creatinine, Ser 0.95; Hemoglobin 15.2; Platelets 248; Potassium 4.0; Sodium 133  Recent Lipid Panel No results found for: CHOL, TRIG, HDL, CHOLHDL, VLDL, LDLCALC, LDLDIRECT   Risk Assessment/Calculations:             Physical Exam:    VS:  BP 118/73   Ht 6' 2 (1.88 m)   Wt 220 lb (99.8 kg)   SpO2 94%   BMI 28.25 kg/m     Wt Readings from Last 3 Encounters:  05/18/24 220 lb (99.8 kg)  12/17/23 220 lb 0.3 oz (99.8 kg)  12/07/23 220 lb 0.3 oz (99.8 kg)     GEN:  Well nourished, well developed in no acute distress HEENT: Normal NECK: No JVD; No carotid bruits CARDIAC: RRR, no murmurs, rubs, gallops RESPIRATORY:  Clear to auscultation without rales, wheezing or rhonchi  ABDOMEN: Soft, non-tender, non-distended MUSCULOSKELETAL:  No edema; No deformity  SKIN: Warm and dry NEUROLOGIC:  Alert and oriented x 3 PSYCHIATRIC:  Normal affect   ASSESSMENT:    1. Dizziness   2. Current smoker    PLAN:    In order of problems listed above:  Dizziness, symptoms resolved after stopping glipizide.  Hypoglycemia from sulfonylurea likely etiology.  Orthostatic vitals with no evidence for orthostasis.  Denies any cardiac symptoms of palpitations, chest pain.  Continue monitoring for now, will consider cardiac workup if symptoms return. Current smoker, smoking cessation advised.  Follow-up as needed     Medication Adjustments/Labs and Tests Ordered: Current medicines are reviewed at length with the patient today.  Concerns regarding medicines are outlined above.  Orders Placed This Encounter  Procedures   EKG 12-Lead   No orders of the defined types were placed in this encounter.   Patient Instructions  Medication Instructions:  Your physician recommends that you continue on your current medications as directed. Please refer to the Current Medication list given to you today.   *If you  need a refill on your cardiac medications before your next appointment, please call your pharmacy*  Lab Work: No labs ordered today  If you have labs (blood work) drawn today and your tests are completely normal, you will receive your results only by: MyChart Message (if you have MyChart) OR A paper copy in the mail If you have any lab test that is abnormal or we need to change your treatment, we will call you to review the results.  Testing/Procedures: No test ordered today   Follow-Up: At [facility], you and your health needs are our priority.  As part of our continuing mission to provide you with exceptional heart care, our providers are all part of one team.  This team includes your primary Cardiologist (physician) and  Advanced Practice Providers or APPs (Physician Assistants and Nurse Practitioners) who all work together to provide you with the care you need, when you need it.  Your next appointment:   As needed         Signed, Redell Cave, MD  05/18/2024 9:04 AM    [facility]

## 2024-05-18 NOTE — Patient Instructions (Signed)
